# Patient Record
Sex: Male | Born: 1980 | Race: White | Hispanic: No | Marital: Married | Smoking: Current some day smoker
Health system: Southern US, Community
[De-identification: ages and names within clinical notes are randomized; demographics above are authoritative.]

## PROBLEM LIST (undated history)

## (undated) DIAGNOSIS — G709 Myoneural disorder, unspecified: Secondary | ICD-10-CM

## (undated) DIAGNOSIS — M5126 Other intervertebral disc displacement, lumbar region: Secondary | ICD-10-CM

## (undated) DIAGNOSIS — Z8781 Personal history of (healed) traumatic fracture: Secondary | ICD-10-CM

---

## 2013-06-06 ENCOUNTER — Ambulatory Visit: Payer: Self-pay | Admitting: Family Medicine

## 2013-07-17 ENCOUNTER — Other Ambulatory Visit: Payer: Self-pay | Admitting: Orthopedic Surgery

## 2013-07-25 ENCOUNTER — Encounter (HOSPITAL_COMMUNITY): Payer: Self-pay | Admitting: Pharmacy Technician

## 2013-07-26 ENCOUNTER — Other Ambulatory Visit (HOSPITAL_COMMUNITY): Payer: Self-pay | Admitting: *Deleted

## 2013-07-27 ENCOUNTER — Encounter (HOSPITAL_COMMUNITY): Payer: Self-pay

## 2013-07-27 ENCOUNTER — Encounter (HOSPITAL_COMMUNITY)
Admission: RE | Admit: 2013-07-27 | Discharge: 2013-07-27 | Disposition: A | Discharge status: Home / Self Care | Payer: BC Managed Care – PPO | Source: Ambulatory Visit | Attending: Specialist | Admitting: Specialist

## 2013-07-27 ENCOUNTER — Ambulatory Visit (HOSPITAL_COMMUNITY)
Admission: RE | Admit: 2013-07-27 | Discharge: 2013-07-27 | Disposition: A | Discharge status: Home / Self Care | Payer: BC Managed Care – PPO | Source: Ambulatory Visit | Attending: Orthopedic Surgery | Admitting: Orthopedic Surgery

## 2013-07-27 ENCOUNTER — Encounter (INDEPENDENT_AMBULATORY_CARE_PROVIDER_SITE_OTHER): Payer: Self-pay

## 2013-07-27 DIAGNOSIS — Z01818 Encounter for other preprocedural examination: Secondary | ICD-10-CM | POA: Insufficient documentation

## 2013-07-27 DIAGNOSIS — R29890 Loss of height: Secondary | ICD-10-CM | POA: Insufficient documentation

## 2013-07-27 DIAGNOSIS — Z01812 Encounter for preprocedural laboratory examination: Secondary | ICD-10-CM | POA: Insufficient documentation

## 2013-07-27 DIAGNOSIS — M545 Low back pain, unspecified: Secondary | ICD-10-CM | POA: Insufficient documentation

## 2013-07-27 HISTORY — DX: Myoneural disorder, unspecified: G70.9

## 2013-07-27 HISTORY — DX: Other intervertebral disc displacement, lumbar region: M51.26

## 2013-07-27 HISTORY — DX: Personal history of (healed) traumatic fracture: Z87.81

## 2013-07-27 LAB — BASIC METABOLIC PANEL
ANION GAP: 13 (ref 5–15)
BUN: 18 mg/dL (ref 6–23)
CHLORIDE: 103 meq/L (ref 96–112)
CO2: 25 mEq/L (ref 19–32)
CREATININE: 1.06 mg/dL (ref 0.50–1.35)
Calcium: 9.7 mg/dL (ref 8.4–10.5)
Glucose, Bld: 85 mg/dL (ref 70–99)
Potassium: 4.1 mEq/L (ref 3.7–5.3)
Sodium: 141 mEq/L (ref 137–147)

## 2013-07-27 LAB — SURGICAL PCR SCREEN
MRSA, PCR: NEGATIVE
STAPHYLOCOCCUS AUREUS: NEGATIVE

## 2013-07-27 LAB — CBC
HCT: 42.5 % (ref 39.0–52.0)
HEMOGLOBIN: 15.2 g/dL (ref 13.0–17.0)
MCH: 28.6 pg (ref 26.0–34.0)
MCHC: 35.8 g/dL (ref 30.0–36.0)
MCV: 80 fL (ref 78.0–100.0)
PLATELETS: 193 10*3/uL (ref 150–400)
RBC: 5.31 MIL/uL (ref 4.22–5.81)
RDW: 11.9 % (ref 11.5–15.5)
WBC: 7.7 10*3/uL (ref 4.0–10.5)

## 2013-07-27 NOTE — Patient Instructions (Addendum)
[name]  07/27/2013   Your procedure is scheduled on: 7-22  -2015 Wednesday at 1000 AM.  Enter through [facility] Entr[facility] and follow signs to [facility]. Arrive at   0800      AM..  Call this number if you have problems the morning of surgery: [phone]  Or Presurgical Testing [phone](Calvin Deleon) For Living Will and/or Health Care Power Attorney Forms: please provide copy for your medical record,may bring AM of surgery(Forms should be already notarized -we do not provide this service).(07-27-13 No information preferred today).    Do not eat food:After Midnight.    Take these medicines the morning of surgery with A SIP OF WATER: Tramadol.   Do not wear jewelry, make-up or nail polish.  Do not wear lotions, powders, or perfumes. You may wear deodorant.  Do not shave 48 hours(2 days) prior to first CHG shower(legs and under arms).(Shaving face and neck okay.)  Do not bring valuables to the hospital.(Hospital is not responsible for lost valuables).  Contacts, dentures or removable bridgework, body piercing, hair pins may not be worn into surgery.  Leave suitcase in the car. After surgery it may be brought to your room.  For patients admitted to the hospital, checkout time is 11:00 AM the day of discharge.(Restricted visitors-Any Persons displaying flu-like symptoms or illness).    Patients discharged the day of surgery will not be allowed to drive home. Must have responsible person with you x 24 hours once discharged.  Name and phone number of your driver:Calvin Deleon -spouse [phone] cell   Special Instructions: CHG(Chlorhedine 4%-"Hibiclens","Betasept","Aplicare") Shower Use Special Wash: see special instructions.(avoid face and genitals)   Please read over the following fact sheets that you were given: MRSA Information,  Incentive Spirometry Instruction.      ________________    [facility] - Preparing for Surgery Before surgery, you can play an  important role.  Because skin is not sterile, your skin needs to be as free of germs as possible.  You can reduce the number of germs on your skin by washing with CHG (chlorahexidine gluconate) soap before surgery.  CHG is an antiseptic cleaner which kills germs and bonds with the skin to continue killing germs even after washing. Please DO NOT use if you have an allergy to CHG or antibacterial soaps.  If your skin becomes reddened/irritated stop using the CHG and inform your nurse when you arrive at Short Stay. Do not shave (including legs and underarms) for at least 48 hours prior to the first CHG shower.  You may shave your face/neck. Please follow these instructions carefully:  1.  Shower with CHG Soap the night before surgery and the  morning of Surgery.  2.  If you choose to wash your hair, wash your hair first as usual with your  normal  shampoo.  3.  After you shampoo, rinse your hair and body thoroughly to remove the  shampoo.                           4.  Use CHG as you would any other liquid soap.  You can apply chg directly  to the skin and wash                       Gently with a scrungie or clean washcloth.  5.  Apply the CHG Soap to your body ONLY FROM THE NECK DOWN.  Do not use on face/ open                           Wound or open sores. Avoid contact with eyes, ears mouth and genitals (private parts).                       Wash face,  Genitals (private parts) with your normal soap.             6.  Wash thoroughly, paying special attention to the area where your surgery  will be performed.  7.  Thoroughly rinse your body with warm water from the neck down.  8.  DO NOT shower/wash with your normal soap after using and rinsing off  the CHG Soap.                9.  Pat yourself dry with a clean towel.            10.  Wear clean pajamas.            11.  Place clean sheets on your bed the night of your first shower and do not  sleep with pets. Day of Surgery : Do not apply any  lotions/deodorants the morning of surgery.  Please wear clean clothes to the hospital/surgery center.  FAILURE TO FOLLOW THESE INSTRUCTIONS MAY RESULT IN THE CANCELLATION OF YOUR SURGERY PATIENT SIGNATURE_________________________________  NURSE SIGNATURE__________________________________  ________________________________________________________________________   Rogelia MireIncentive Spirometer  An incentive spirometer is a tool that can help keep your lungs clear and active. This tool measures how well you are filling your lungs with each breath. Taking long deep breaths may help reverse or decrease the chance of developing breathing (pulmonary) problems (especially infection) following:  A long period of time when you are unable to move or be active. BEFORE THE PROCEDURE   If the spirometer includes an indicator to show your best effort, your nurse or respiratory therapist will set it to a desired goal.  If possible, sit up straight or lean slightly forward. Try not to slouch.  Hold the incentive spirometer in an upright position. INSTRUCTIONS FOR USE  1. Sit on the edge of your bed if possible, or sit up as far as you can in bed or on a chair. 2. Hold the incentive spirometer in an upright position. 3. Breathe out normally. 4. Place the mouthpiece in your mouth and seal your lips tightly around it. 5. Breathe in slowly and as deeply as possible, raising the piston or the ball toward the top of the column. 6. Hold your breath for 3-5 seconds or for as long as possible. Allow the piston or ball to fall to the bottom of the column. 7. Remove the mouthpiece from your mouth and breathe out normally. 8. Rest for a few seconds and repeat Steps 1 through 7 at least 10 times every 1-2 hours when you are awake. Take your time and take a few normal breaths between deep breaths. 9. The spirometer may include an indicator to show your best effort. Use the indicator as a goal to work toward during each  repetition. 10. After each set of 10 deep breaths, practice coughing to be sure your lungs are clear. If you have an incision (the cut made at the time of surgery), support your incision when coughing by placing a pillow or rolled up towels firmly against it. Once you are able to get out  of bed, walk around indoors and cough well. You may stop using the incentive spirometer when instructed by your caregiver.  RISKS AND COMPLICATIONS  Take your time so you do not get dizzy or light-headed.  If you are in pain, you may need to take or ask for pain medication before doing incentive spirometry. It is harder to take a deep breath if you are having pain. AFTER USE  Rest and breathe slowly and easily.  It can be helpful to keep track of a log of your progress. Your caregiver can provide you with a simple table to help with this. If you are using the spirometer at home, follow these instructions: [place] IF:   You are having difficultly using the spirometer.  You have trouble using the spirometer as often as instructed.  Your pain medication is not giving enough relief while using the spirometer.  You develop fever of 100.5 F (38.1 C) or higher. SEEK IMMEDIATE MEDICAL CARE IF:   You cough up bloody sputum that had not been present before.  You develop fever of 102 F (38.9 C) or greater.  You develop worsening pain at or near the incision site. MAKE SURE YOU:   Understand these instructions.  Will watch your condition.  Will get help right away if you are not doing well or get worse. Document Released: 05/10/2006 Document Revised: 03/22/2011 Document Reviewed: 07/11/2006 [facility] Patient Information 2014 [place], [place].   ________________________________________________________________________

## 2013-08-01 ENCOUNTER — Encounter (HOSPITAL_COMMUNITY)
Admission: RE | Disposition: A | Discharge status: Home / Self Care | Payer: Self-pay | Source: Ambulatory Visit | Attending: Specialist

## 2013-08-01 ENCOUNTER — Ambulatory Visit (HOSPITAL_COMMUNITY): Payer: BC Managed Care – PPO

## 2013-08-01 ENCOUNTER — Encounter (HOSPITAL_COMMUNITY): Payer: Self-pay | Admitting: *Deleted

## 2013-08-01 ENCOUNTER — Ambulatory Visit (HOSPITAL_COMMUNITY): Payer: BC Managed Care – PPO | Admitting: Anesthesiology

## 2013-08-01 ENCOUNTER — Ambulatory Visit (HOSPITAL_COMMUNITY)
Admission: RE | Admit: 2013-08-01 | Discharge: 2013-08-02 | Disposition: A | Discharge status: Home / Self Care | Payer: BC Managed Care – PPO | Source: Ambulatory Visit | Attending: Specialist | Admitting: Specialist

## 2013-08-01 ENCOUNTER — Encounter (HOSPITAL_COMMUNITY): Payer: BC Managed Care – PPO | Admitting: Anesthesiology

## 2013-08-01 DIAGNOSIS — M5126 Other intervertebral disc displacement, lumbar region: Secondary | ICD-10-CM | POA: Diagnosis present

## 2013-08-01 DIAGNOSIS — F17200 Nicotine dependence, unspecified, uncomplicated: Secondary | ICD-10-CM | POA: Insufficient documentation

## 2013-08-01 HISTORY — PX: LUMBAR LAMINECTOMY/DECOMPRESSION MICRODISCECTOMY: SHX5026

## 2013-08-01 SURGERY — LUMBAR LAMINECTOMY/DECOMPRESSION MICRODISCECTOMY 1 LEVEL
Anesthesia: General | Site: Back | Laterality: Right

## 2013-08-01 MED ORDER — HYDROCODONE-ACETAMINOPHEN 5-325 MG PO TABS: 1.0000 | ORAL_TABLET | ORAL | Status: DC | PRN

## 2013-08-01 MED ORDER — KCL IN DEXTROSE-NACL 20-5-0.45 MEQ/L-%-% IV SOLN
INTRAVENOUS | Status: DC
Start: 2013-08-01 — End: 2013-08-02
  Filled 2013-08-01 (×2): qty 1000

## 2013-08-01 MED ORDER — MIDAZOLAM HCL 2 MG/2ML IJ SOLN
INTRAMUSCULAR | Status: AC
  Filled 2013-08-01: qty 2

## 2013-08-01 MED ORDER — THROMBIN 5000 UNITS EX SOLR
CUTANEOUS | Status: AC
  Filled 2013-08-01: qty 10000

## 2013-08-01 MED ORDER — MENTHOL 3 MG MT LOZG: 1.0000 | LOZENGE | OROMUCOSAL | Status: DC | PRN

## 2013-08-01 MED ORDER — POLYMYXIN B SULFATE 500000 UNITS IJ SOLR
INTRAMUSCULAR | Status: DC | PRN
  Administered 2013-08-01: 11:00:00

## 2013-08-01 MED ORDER — SUFENTANIL CITRATE 50 MCG/ML IV SOLN
INTRAVENOUS | Status: DC | PRN
Start: 2013-08-01 — End: 2013-08-01
  Administered 2013-08-01: 5 ug via INTRAVENOUS
  Administered 2013-08-01: 15 ug via INTRAVENOUS
  Administered 2013-08-01: 10 ug via INTRAVENOUS
  Administered 2013-08-01 (×2): 5 ug via INTRAVENOUS

## 2013-08-01 MED ORDER — DOCUSATE SODIUM 100 MG PO CAPS
100.0000 mg | ORAL_CAPSULE | Freq: Two times a day (BID) | ORAL | Status: DC
  Administered 2013-08-01 – 2013-08-02 (×3): 100 mg via ORAL

## 2013-08-01 MED ORDER — MEPERIDINE HCL 50 MG/ML IJ SOLN: 6.2500 mg | INTRAMUSCULAR | Status: DC | PRN

## 2013-08-01 MED ORDER — ONDANSETRON HCL 4 MG/2ML IJ SOLN
INTRAMUSCULAR | Status: DC | PRN
  Administered 2013-08-01: 4 mg via INTRAVENOUS

## 2013-08-01 MED ORDER — PROPOFOL 10 MG/ML IV BOLUS
INTRAVENOUS | Status: DC | PRN
  Administered 2013-08-01: 50 mg via INTRAVENOUS
  Administered 2013-08-01: 150 mg via INTRAVENOUS

## 2013-08-01 MED ORDER — HYDROMORPHONE HCL PF 1 MG/ML IJ SOLN
INTRAMUSCULAR | Status: AC
  Filled 2013-08-01: qty 1

## 2013-08-01 MED ORDER — PROPOFOL 10 MG/ML IV BOLUS
INTRAVENOUS | Status: AC
  Filled 2013-08-01: qty 20

## 2013-08-01 MED ORDER — TRAMADOL HCL 50 MG PO TABS
25.0000 mg | ORAL_TABLET | Freq: Four times a day (QID) | ORAL | Status: DC | PRN
Start: 2013-08-01 — End: 2013-08-02
  Administered 2013-08-01: 50 mg via ORAL
  Filled 2013-08-01 (×2): qty 1

## 2013-08-01 MED ORDER — ROCURONIUM BROMIDE 100 MG/10ML IV SOLN
INTRAVENOUS | Status: AC
  Filled 2013-08-01: qty 1

## 2013-08-01 MED ORDER — OXYCODONE-ACETAMINOPHEN 5-325 MG PO TABS
1.0000 | ORAL_TABLET | ORAL | Status: DC | PRN
  Administered 2013-08-01: 1 via ORAL
  Administered 2013-08-01 – 2013-08-02 (×2): 2 via ORAL
  Filled 2013-08-01 (×2): qty 2
  Filled 2013-08-01: qty 1

## 2013-08-01 MED ORDER — HYDROCODONE-ACETAMINOPHEN 5-325 MG PO TABS: 1.0000 | ORAL_TABLET | Freq: Four times a day (QID) | ORAL | Status: DC | PRN

## 2013-08-01 MED ORDER — PROMETHAZINE HCL 25 MG/ML IJ SOLN
6.2500 mg | INTRAMUSCULAR | Status: DC | PRN
Start: 2013-08-01 — End: 2013-08-01

## 2013-08-01 MED ORDER — DEXAMETHASONE SODIUM PHOSPHATE 4 MG/ML IJ SOLN
INTRAMUSCULAR | Status: DC | PRN
  Administered 2013-08-01: 10 mg via INTRAVENOUS

## 2013-08-01 MED ORDER — SODIUM CHLORIDE 0.9 % IJ SOLN: 3.0000 mL | Freq: Two times a day (BID) | INTRAMUSCULAR | Status: DC

## 2013-08-01 MED ORDER — GELATIN ABSORBABLE MT POWD
OROMUCOSAL | Status: DC | PRN
  Administered 2013-08-01: 11:00:00 via TOPICAL

## 2013-08-01 MED ORDER — LACTATED RINGERS IV SOLN: INTRAVENOUS | Status: DC

## 2013-08-01 MED ORDER — GLYCOPYRROLATE 0.2 MG/ML IJ SOLN
INTRAMUSCULAR | Status: DC | PRN
  Administered 2013-08-01: .8 mg via INTRAVENOUS

## 2013-08-01 MED ORDER — LACTATED RINGERS IV SOLN
INTRAVENOUS | Status: DC | PRN
  Administered 2013-08-01: 10:00:00 via INTRAVENOUS

## 2013-08-01 MED ORDER — LIDOCAINE HCL (CARDIAC) 20 MG/ML IV SOLN
INTRAVENOUS | Status: DC | PRN
  Administered 2013-08-01: 100 mg via INTRAVENOUS

## 2013-08-01 MED ORDER — METHOCARBAMOL 500 MG PO TABS: 500.0000 mg | ORAL_TABLET | Freq: Four times a day (QID) | ORAL | Status: DC | PRN

## 2013-08-01 MED ORDER — BUPIVACAINE-EPINEPHRINE 0.5% -1:200000 IJ SOLN
INTRAMUSCULAR | Status: DC | PRN
  Administered 2013-08-01: 10 mL

## 2013-08-01 MED ORDER — MIDAZOLAM HCL 5 MG/5ML IJ SOLN
INTRAMUSCULAR | Status: DC | PRN
  Administered 2013-08-01: 2 mg via INTRAVENOUS

## 2013-08-01 MED ORDER — ROCURONIUM BROMIDE 100 MG/10ML IV SOLN
INTRAVENOUS | Status: DC | PRN
  Administered 2013-08-01: 40 mg via INTRAVENOUS

## 2013-08-01 MED ORDER — ONDANSETRON HCL 4 MG/2ML IJ SOLN
INTRAMUSCULAR | Status: AC
  Filled 2013-08-01: qty 2

## 2013-08-01 MED ORDER — CEFAZOLIN SODIUM-DEXTROSE 2-3 GM-% IV SOLR
2.0000 g | Freq: Three times a day (TID) | INTRAVENOUS | Status: AC
  Administered 2013-08-01 – 2013-08-02 (×3): 2 g via INTRAVENOUS
  Filled 2013-08-01 (×3): qty 50

## 2013-08-01 MED ORDER — NEOSTIGMINE METHYLSULFATE 10 MG/10ML IV SOLN
INTRAVENOUS | Status: DC | PRN
  Administered 2013-08-01: 5 mg via INTRAVENOUS

## 2013-08-01 MED ORDER — HYDROMORPHONE HCL PF 1 MG/ML IJ SOLN
0.2500 mg | INTRAMUSCULAR | Status: DC | PRN
  Administered 2013-08-01 (×4): 0.5 mg via INTRAVENOUS

## 2013-08-01 MED ORDER — LACTATED RINGERS IV SOLN
INTRAVENOUS | Status: DC
  Administered 2013-08-01: 1000 mL via INTRAVENOUS

## 2013-08-01 MED ORDER — ONDANSETRON HCL 4 MG/2ML IJ SOLN
4.0000 mg | INTRAMUSCULAR | Status: DC | PRN
  Administered 2013-08-01: 4 mg via INTRAVENOUS
  Filled 2013-08-01: qty 2

## 2013-08-01 MED ORDER — LIDOCAINE HCL (CARDIAC) 20 MG/ML IV SOLN
INTRAVENOUS | Status: AC
  Filled 2013-08-01: qty 5

## 2013-08-01 MED ORDER — PHENOL 1.4 % MT LIQD: 1.0000 | OROMUCOSAL | Status: DC | PRN

## 2013-08-01 MED ORDER — ACETAMINOPHEN 325 MG PO TABS
650.0000 mg | ORAL_TABLET | ORAL | Status: DC | PRN
  Filled 2013-08-01: qty 2

## 2013-08-01 MED ORDER — BUPIVACAINE-EPINEPHRINE (PF) 0.5% -1:200000 IJ SOLN
INTRAMUSCULAR | Status: AC
  Filled 2013-08-01: qty 30

## 2013-08-01 MED ORDER — METHOCARBAMOL 500 MG PO TABS: 500.0000 mg | ORAL_TABLET | Freq: Four times a day (QID) | ORAL | Status: DC

## 2013-08-01 MED ORDER — HYDROMORPHONE HCL PF 1 MG/ML IJ SOLN
0.5000 mg | INTRAMUSCULAR | Status: DC | PRN
  Administered 2013-08-01: 1 mg via INTRAVENOUS
  Filled 2013-08-01: qty 1

## 2013-08-01 MED ORDER — SODIUM CHLORIDE 0.9 % IJ SOLN
INTRAMUSCULAR | Status: AC
  Filled 2013-08-01: qty 10

## 2013-08-01 MED ORDER — METHOCARBAMOL 1000 MG/10ML IJ SOLN
500.0000 mg | Freq: Four times a day (QID) | INTRAVENOUS | Status: DC | PRN
  Administered 2013-08-01: 500 mg via INTRAVENOUS
  Filled 2013-08-01: qty 5

## 2013-08-01 MED ORDER — CEFAZOLIN SODIUM-DEXTROSE 2-3 GM-% IV SOLR
INTRAVENOUS | Status: AC
  Filled 2013-08-01: qty 50

## 2013-08-01 MED ORDER — SUFENTANIL CITRATE 50 MCG/ML IV SOLN
INTRAVENOUS | Status: AC
  Filled 2013-08-01: qty 1

## 2013-08-01 MED ORDER — SODIUM CHLORIDE 0.9 % IJ SOLN: 3.0000 mL | INTRAMUSCULAR | Status: DC | PRN

## 2013-08-01 MED ORDER — CEFAZOLIN SODIUM-DEXTROSE 2-3 GM-% IV SOLR
2.0000 g | INTRAVENOUS | Status: AC
  Administered 2013-08-01: 2 g via INTRAVENOUS

## 2013-08-01 MED ORDER — SODIUM CHLORIDE 0.9 % IV SOLN: 250.0000 mL | INTRAVENOUS | Status: DC

## 2013-08-01 MED ORDER — DOCUSATE SODIUM 100 MG PO CAPS: 100.0000 mg | ORAL_CAPSULE | Freq: Two times a day (BID) | ORAL | Status: DC

## 2013-08-01 MED ORDER — ACETAMINOPHEN 650 MG RE SUPP: 650.0000 mg | RECTAL | Status: DC | PRN

## 2013-08-01 SURGICAL SUPPLY — 45 items
BAG ZIPLOCK 12X15 (MISCELLANEOUS) IMPLANT
CLEANER TIP ELECTROSURG 2X2 (MISCELLANEOUS) ×3 IMPLANT
CLOSURE WOUND 1/2 X4 (GAUZE/BANDAGES/DRESSINGS) ×1
CLOTH 2% CHLOROHEXIDINE 3PK (PERSONAL CARE ITEMS) ×3 IMPLANT
DRAPE MICROSCOPE LEICA (MISCELLANEOUS) ×3 IMPLANT
DRAPE POUCH INSTRU U-SHP 10X18 (DRAPES) ×3 IMPLANT
DRAPE SURG 17X11 SM STRL (DRAPES) ×3 IMPLANT
DRAPE UTILITY XL STRL (DRAPES) ×3 IMPLANT
DRSG AQUACEL AG ADV 3.5X 4 (GAUZE/BANDAGES/DRESSINGS) ×3 IMPLANT
DRSG AQUACEL AG ADV 3.5X 6 (GAUZE/BANDAGES/DRESSINGS) IMPLANT
DURAPREP 26ML APPLICATOR (WOUND CARE) ×3 IMPLANT
DURASEAL SPINE SEALANT 3ML (MISCELLANEOUS) IMPLANT
ELECT BLADE TIP CTD 4 INCH (ELECTRODE) IMPLANT
ELECT REM PT RETURN 9FT ADLT (ELECTROSURGICAL) ×3
ELECTRODE REM PT RTRN 9FT ADLT (ELECTROSURGICAL) ×1 IMPLANT
GLOVE BIOGEL PI IND STRL 7.5 (GLOVE) ×1 IMPLANT
GLOVE BIOGEL PI INDICATOR 7.5 (GLOVE) ×2
GLOVE SURG SS PI 7.5 STRL IVOR (GLOVE) ×3 IMPLANT
GLOVE SURG SS PI 8.0 STRL IVOR (GLOVE) ×6 IMPLANT
GOWN STRL REUS W/TWL XL LVL3 (GOWN DISPOSABLE) ×6 IMPLANT
IV CATH 14GX2 1/4 (CATHETERS) ×3 IMPLANT
KIT BASIN OR (CUSTOM PROCEDURE TRAY) ×3 IMPLANT
KIT POSITIONING SURG ANDREWS (MISCELLANEOUS) ×3 IMPLANT
MANIFOLD NEPTUNE II (INSTRUMENTS) ×3 IMPLANT
NEEDLE SPNL 18GX3.5 QUINCKE PK (NEEDLE) ×6 IMPLANT
PATTIES SURGICAL .5 X.5 (GAUZE/BANDAGES/DRESSINGS) IMPLANT
PATTIES SURGICAL .75X.75 (GAUZE/BANDAGES/DRESSINGS) IMPLANT
PATTIES SURGICAL 1X1 (DISPOSABLE) IMPLANT
RUBBERBAND STERILE (MISCELLANEOUS) ×6 IMPLANT
SPONGE SURGIFOAM ABS GEL 100 (HEMOSTASIS) ×3 IMPLANT
STAPLER VISISTAT (STAPLE) IMPLANT
STRIP CLOSURE SKIN 1/2X4 (GAUZE/BANDAGES/DRESSINGS) ×2 IMPLANT
SUT NURALON 4 0 TR CR/8 (SUTURE) IMPLANT
SUT PROLENE 3 0 PS 2 (SUTURE) ×3 IMPLANT
SUT VIC AB 1 CT1 27 (SUTURE)
SUT VIC AB 1 CT1 27XBRD ANTBC (SUTURE) IMPLANT
SUT VIC AB 1-0 CT2 27 (SUTURE) ×3 IMPLANT
SUT VIC AB 2-0 CT1 27 (SUTURE)
SUT VIC AB 2-0 CT1 TAPERPNT 27 (SUTURE) IMPLANT
SUT VIC AB 2-0 CT2 27 (SUTURE) ×3 IMPLANT
SYRINGE 3CC LL L/F (MISCELLANEOUS) ×3 IMPLANT
TOWEL OR 17X26 10 PK STRL BLUE (TOWEL DISPOSABLE) ×3 IMPLANT
TOWEL OR NON WOVEN STRL DISP B (DISPOSABLE) IMPLANT
TRAY LAMINECTOMY (CUSTOM PROCEDURE TRAY) ×3 IMPLANT
YANKAUER SUCT BULB TIP NO VENT (SUCTIONS) IMPLANT

## 2013-08-01 NOTE — Anesthesia Preprocedure Evaluation (Addendum)

## 2013-08-01 NOTE — H&P (Signed)
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 The various methods of treatment have been discussed with the patient and family. After consideration of risks, benefits and other options for treatment, the patient has consented to  Procedure(s): MICRO LUMBAR DECOMPRESSION L4-5 ON THE Right 1 LEVEL (Right) as a surgical intervention .  The patient's history has been reviewed, patient examined, no change in status, stable for surgery.  I have reviewed the patient's chart and labs.  Questions were answered to the patient's satisfaction.

## 2013-08-01 NOTE — Transfer of Care (Signed)
Immediate Anesthesia Transfer of Care Note  Patient: Calvin Deleon  Procedure(s) Performed: Procedure(s): MICRO LUMBAR DECOMPRESSION L4 - L5 ON THE RIGHT 1 LEVEL (Right)  Patient Location: PACU  Anesthesia Type:General  Level of Consciousness: awake, alert  and oriented  Airway & Oxygen Therapy: Patient Spontanous Breathing and Patient connected to face mask oxygen  Post-op Assessment: Report given to PACU RN and Post -op Vital signs reviewed and stable  Post vital signs: Reviewed and stable  Complications: No apparent anesthesia complications

## 2013-08-01 NOTE — Discharge Instructions (Signed)
Walk As Tolerated utilizing back precautions.  No bending, twisting, or lifting.  No driving for 2 weeks.   °Aquacel dressing may remain in place for 7 days. May shower with aquacel dressing in place. After 7 days, remove aquacel dressing and place gauze and tape dressing which should be kept clean and dry and changed daily. Do not remove steri-strips if they are present. °See Dr. Beane in office in 10 to 14 days. Begin taking aspirin 81mg per day starting 4 days after your surgery if not allergic to aspirin or on another blood thinner. °Walk daily even outside. Use a cane or walker only if necessary. °Avoid sitting on soft sofas. ° °

## 2013-08-01 NOTE — Transfer of Care (Signed)
Immediate Anesthesia Transfer of Care Note  Patient: Calvin Deleon  Procedure(s) Performed: Procedure(s): MICRO LUMBAR DECOMPRESSION L4 - L5 ON THE RIGHT 1 LEVEL (Right)  Patient Location: PACU  Anesthesia Type:General  Level of Consciousness: awake, alert  and oriented  Airway & Oxygen Therapy: Patient Spontanous Breathing and Patient connected to face mask oxygen  Post-op Assessment: Report given to PACU RN and Post -op Vital signs reviewed and stable  Post vital signs: Reviewed and stable  Complications: No apparent anesthesia complications 

## 2013-08-01 NOTE — H&P (Signed)
Calvin Deleon is an 33 y.o. male.   Chief Complaint: back and R leg pain HPI: 14 weeks s/p injury with R leg pain, numbness and tingling into R foot, pain with sitting and flexion. Refractory to medications, activity modifications, relative rest, steroid.  Past Medical History  Diagnosis Date  . Lumbar herniated disc   . Neuromuscular disorder     "sciatic nerve pain to toes-right side at present  . History of fracture     finger x2 , lt. forearm- no retained hardware    No past surgical history on file.  No family history on file. Social History:  reports that he has been smoking Cigars.  He does not have any smokeless tobacco history on file. He reports that he drinks alcohol. He reports that he does not use illicit drugs.  Allergies: No Known Allergies  No prescriptions prior to admission    No results found for this or any previous visit (from the past 48 hour(s)). No results found.  Review of Systems  Constitutional: Negative.   HENT: Negative.   Eyes: Negative.   Respiratory: Negative.   Cardiovascular: Negative.   Gastrointestinal: Negative.   Genitourinary: Negative.   Musculoskeletal: Positive for back pain.  Skin: Negative.   Neurological: Positive for sensory change and focal weakness.  Psychiatric/Behavioral: Negative.     There were no vitals taken for this visit. Physical Exam  Constitutional: He is oriented to person, place, and time. He appears well-developed and well-nourished.  HENT:  Head: Normocephalic and atraumatic.  Eyes: Conjunctivae and EOM are normal. Pupils are equal, round, and reactive to light.  Neck: Normal range of motion. Neck supple.  Cardiovascular: Normal rate and regular rhythm.   Respiratory: Effort normal and breath sounds normal.  GI: Soft. Bowel sounds are normal.  Neurological: He is alert and oriented to person, place, and time. He has normal reflexes.  Skin: Skin is warm and dry.  Psychiatric: He has a normal mood and  affect.   Musculoskeletal: ttp LS junction. Pain with fwd flexion and extension Lspine. SLR R positive with buttock, thigh and calf pain exacerbated with dorsal augmentation maneuver. EHL 4+/5 right otherwise strength WNL. Altered sensation L5 dermatome.  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 All options were discussed and he desires to proceed with surgery.  Plan microlumbar decompression L4-5 right  Wandra Babin M. PA-C for Dr. Shelle IronBeane 08/01/2013, 7:20 AM

## 2013-08-01 NOTE — Interval H&P Note (Signed)
History and Physical Interval Note:  08/01/2013 9:08 AM  Calvin MageMatthew Wilk  has presented today for surgery, with the diagnosis of HNP L4 - L5 on the Right  The various methods of treatment have been discussed with the patient and family. After consideration of risks, benefits and other options for treatment, the patient has consented to  Procedure(s): MICRO LUMBAR DECOMPRESSION L4 - L5 ON THE RIGHT 1 LEVEL (Right) as a surgical intervention .  The patient's history has been reviewed, patient examined, no change in status, stable for surgery.  I have reviewed the patient's chart and labs.  Questions were answered to the patient's satisfaction.     Arcola Freshour C

## 2013-08-01 NOTE — Op Note (Signed)
NAME:  Calvin Deleon, Calvin Deleon NO.:  [id number]  MEDICAL RECORD NO.:  [id number]  LOCATION:  [facility]                         FACILITY:  [facility]  PHYSICIAN:  Calvin Deleon, M.D.    DATE OF BIRTH:  08-19-1980  DATE OF PROCEDURE:  08/01/2013 DATE OF DISCHARGE:                              OPERATIVE REPORT   PREOPERATIVE DIAGNOSES:  Spinal stenosis, herniated nucleus pulposus, degenerative disk disease at L4-5.  POSTOPERATIVE DIAGNOSES:  Spinal stenosis, herniated nucleus pulposus, degenerative disk disease at L4-5.  PROCEDURES PERFORMED: 1. Microlumbar decompression, L4-5, right. 2. Foraminotomies, L4 and L5, right. 3. Lysis of epidural venous plexus. 4. Microdiskectomy, L4-5, right.  ANESTHESIA:  General.  ASSISTANT:  Lanna Poche, PA.  BRIEF HISTORY:  This is a 33 year old with refractory L5 radiculopathy, large HNP, compressing the 5 root.  He was indicated for decompression given EHL, weakness in neural tension signs.  Risk and benefits were discussed including bleeding, infection, damage to neurovascular structures, DVT, PE, anesthetic complications, etc.  TECHNIQUE:  With the patient in supine position, after induction of adequate general anesthesia, 2 g of Kefzol, he was placed prone on the [place] frame.  All bony prominences were well padded.  Lumbar region was prepped and draped in usual sterile fashion.  Two 18-gauge spinal needles were utilized to localize the L4-5 interspace, confirmed with x- ray.  Incision was made from the spinous process of 4-5.  Subcutaneous tissue was dissected.  Electrocautery was utilized to achieve hemostasis.  Dorsolumbar fascia was divided.  We split the paraspinous musculature, placed a self-retaining retractor.  Operating microscope was draped and brought on the surgical field.  After confirmatory radiograph obtained at 4-5, hemilaminotomy of the caudad edge of L4 was performed with the 2-mm Kerrison.  I detached the  ligamentum flavum. Straight curette was utilized to detach the ligamentum flavum from the cephalad edge of 5.  Ligamentum flavum was then removed from the interspace with a neuro patty placed beneath the ligamentum flavum to protect the nerve root.  Hypertrophic ligamentum flavum was noted. Facet arthropathy was noted and enlargement.  I performed a generous foraminotomy of L5, identified the 5 root, gently mobilizing it medially.  It was tethered along the 5 root of the shoulder and down the 5 root laterally, but extensive epidural venous plexus.  This was meticulously cauterized and divided to further mobilize the 5 root.  We then identified an extruded fragment from the disk space at 4-5.  This was removed with a micropituitary and a nerve hook, two large fragments were removed.  We then entered the disk space with a micropituitary and performed a full diskectomy of all herniated material.  We irrigated the disk space with antibiotic irrigation and then again checked and no residual disk herniation.  Foraminotomy of 4 was performed as well. Inspection revealed no evidence of CSF leakage or active bleeding. There was 1 cm of excursion of the L5 nerve root medial to the pedicle without tension.  We obtained a confirmatory radiograph.  It was copiously irrigated.  After this, thrombin-soaked Gelfoam was placed in the laminotomy defect.  We removed the McCullough retractor, irrigated the paraspinous musculature, closed the fascia with 1  Vicryl, subcu with 2-0, skin with Prolene stitch.  Sterile dressing was applied.  Placed supine on the hospital bed, extubated without difficulty, and transported to the recovery room in satisfactory condition.  The patient tolerated the procedure well.  No complications.  Minimal blood loss.  Assistant, Lanna PocheJacqueline Bissell, [place].     Calvin EveryJeffrey Halaina Deleon, M.D.     Cordelia PenJB/MEDQ  D:  08/01/2013  T:  08/01/2013  Job:  [id number]

## 2013-08-01 NOTE — Brief Op Note (Signed)
08/01/2013  11:34 AM  PATIENT:  Calvin MageMatthew Deleon  33 y.o. male  PRE-OPERATIVE DIAGNOSIS:  HNP L4 - L5 on the Right  POST-OPERATIVE DIAGNOSIS:  Herniated Nucleus Pulplsus L4-L5 Right  PROCEDURE:  Procedure(s): MICRO LUMBAR DECOMPRESSION L4 - L5 ON THE RIGHT 1 LEVEL (Right)  SURGEON:  Surgeon(s) and Role:    * Javier DockerJeffrey C Tawania Daponte, MD - Primary  PHYSICIAN ASSISTANT:   ASSISTANTS: Bissell   ANESTHESIA:   general  EBL:     BLOOD ADMINISTERED:none  DRAINS: none   LOCAL MEDICATIONS USED:  MARCAINE     SPECIMEN:  Source of Specimen:  [id number]  DISPOSITION OF SPECIMEN:  PATHOLOGY  COUNTS:  YES  TOURNIQUET:  * No tourniquets in log *  DICTATION: .Other Dictation: Dictation Number [id number]  PLAN OF CARE: Admit for overnight observation  PATIENT DISPOSITION:  PACU - hemodynamically stable.   Delay start of Pharmacological VTE agent (>24hrs) due to surgical blood loss or risk of bleeding: yes

## 2013-08-01 NOTE — Anesthesia Postprocedure Evaluation (Signed)
  Anesthesia Post-op Note  Patient: Trish MageMatthew [place]uk  Procedure(s) Performed: Procedure(s) (LRB): MICRO LUMBAR DECOMPRESSION L4 - L5 ON THE RIGHT 1 LEVEL (Right)  Patient Location: PACU  Anesthesia Type: General  Level of Consciousness: awake and alert   Airway and Oxygen Therapy: Patient Spontanous Breathing  Post-op Pain: mild  Post-op Assessment: Post-op Vital signs reviewed, Patient's Cardiovascular Status Stable, Respiratory Function Stable, Patent Airway and No signs of Nausea or vomiting  Last Vitals:  Filed Vitals:   08/01/13 1147  BP: 124/69  Pulse:   Temp: 36.7 C  Resp: 15    Post-op Vital Signs: stable   Complications: No apparent anesthesia complications

## 2013-08-02 ENCOUNTER — Encounter (HOSPITAL_COMMUNITY): Payer: Self-pay | Admitting: Specialist

## 2013-08-02 NOTE — Progress Notes (Signed)
Subjective: 1 Day Post-Op Procedure(s) (LRB): MICRO LUMBAR DECOMPRESSION L4 - L5 ON THE RIGHT 1 LEVEL (Right) Patient reports pain as moderate.   Seen by Dr. Shelle IronBeane in AM rounds. Reports resolution of leg pain, was initially taking ultram for pain but then needed to switch to dilaudid for back pain. Voiding without difficulty.  Objective: Vital signs in last 24 hours: Temp:  [97.8 F (36.6 C)-98.6 F (37 C)] 98 F (36.7 C) (07/23 0530) Pulse Rate:  [52-98] 78 (07/23 0530) Resp:  [9-18] 16 (07/23 0530) BP: (107-148)/(54-89) 110/64 mmHg (07/23 0530) SpO2:  [94 %-100 %] 97 % (07/23 0530) Weight:  [90.946 kg (200 lb 8 oz)] 90.946 kg (200 lb 8 oz) (07/22 0830)  Intake/Output from previous day: 07/22 0701 - 07/23 0700 In: 2440 [P.O.:440; I.V.:1850; IV Piggyback:150] Out: 600 [Urine:600] Intake/Output this shift:    No results found for this basename: HGB,  in the last 72 hours No results found for this basename: WBC, RBC, HCT, PLT,  in the last 72 hours No results found for this basename: NA, K, CL, CO2, BUN, CREATININE, GLUCOSE, CALCIUM,  in the last 72 hours No results found for this basename: LABPT, INR,  in the last 72 hours  Neurologically intact ABD soft Neurovascular intact Sensation intact distally Intact pulses distally Dorsiflexion/Plantar flexion intact Incision: dressing C/D/I and no drainage No cellulitis present Compartment soft no calf pain  Assessment/Plan: 1 Day Post-Op Procedure(s) (LRB): MICRO LUMBAR DECOMPRESSION L4 - L5 ON THE RIGHT 1 LEVEL (Right) Advance diet Up with therapy D/C IV fluids Plan D/C later today after PT as long as pain remains well controlled Reviewed D/C instructions, dressing instructions, Lspine precautions Follow up 10-14 days post-op for suture removal  BISSELL, JACLYN M. 08/02/2013, 7:34 AM

## 2013-08-02 NOTE — Discharge Summary (Signed)
Physician Discharge Summary   Patient ID: Calvin Deleon MRN: [id number] DOB/AGE: 06-24-1980 33 y.o.  Admit date: 08/01/2013 Discharge date: 08/02/2013  Primary Diagnosis:   HNP L4 - L5 on the Right  Admission Diagnoses:  Past Medical History  Diagnosis Date  . Lumbar herniated disc   . Neuromuscular disorder     "sciatic nerve pain to toes-right side at present  . History of fracture     finger x2 , lt. forearm- no retained hardware   Discharge Diagnoses:   Active Problems:   HNP (herniated nucleus pulposus), lumbar  Procedure:  Procedure(s) (LRB): MICRO LUMBAR DECOMPRESSION L4 - L5 ON THE RIGHT 1 LEVEL (Right)   Consults: None  HPI:  see H&P    Laboratory Data: Hospital Outpatient Visit on 07/27/2013  Component Date Value Ref Range Status  . MRSA, PCR 07/27/2013 NEGATIVE  NEGATIVE Final  . Staphylococcus aureus 07/27/2013 NEGATIVE  NEGATIVE Final   Comment:                                 The Xpert SA Assay (FDA                          approved for NASAL specimens                          in patients over 25 years of age),                          is one component of                          a comprehensive surveillance                          program.  Test performance has                          been validated by [employer] for patients greater                          than or equal to 19 year old.                          It is not intended                          to diagnose infection nor to                          guide or monitor treatment.  . Sodium 07/27/2013 141  137 - 147 mEq/L Final  . Potassium 07/27/2013 4.1  3.7 - 5.3 mEq/L Final  . Chloride 07/27/2013 103  96 - 112 mEq/L Final  . CO2 07/27/2013 25  19 - 32 mEq/L Final  . Glucose, Bld 07/27/2013 85  70 - 99 mg/dL Final  . BUN 07/27/2013 18  6 - 23 mg/dL Final  . Creatinine, Ser 07/27/2013 1.06  0.50 -  1.35 mg/dL Final  . Calcium 07/27/2013 9.7  8.4 - 10.5 mg/dL  Final  . GFR calc non Af Amer 07/27/2013 >90  >90 mL/min Final  . GFR calc Af Amer 07/27/2013 >90  >90 mL/min Final   Comment: (NOTE)                          The eGFR has been calculated using the CKD EPI equation.                          This calculation has not been validated in all clinical situations.                          eGFR's persistently <90 mL/min signify possible Chronic Kidney                          Disease.  . Anion gap 07/27/2013 13  5 - 15 Final  . WBC 07/27/2013 7.7  4.0 - 10.5 K/uL Final  . RBC 07/27/2013 5.31  4.22 - 5.81 MIL/uL Final  . Hemoglobin 07/27/2013 15.2  13.0 - 17.0 g/dL Final  . HCT 07/27/2013 42.5  39.0 - 52.0 % Final  . MCV 07/27/2013 80.0  78.0 - 100.0 fL Final  . MCH 07/27/2013 28.6  26.0 - 34.0 pg Final  . MCHC 07/27/2013 35.8  30.0 - 36.0 g/dL Final  . RDW 07/27/2013 11.9  11.5 - 15.5 % Final  . Platelets 07/27/2013 193  150 - 400 K/uL Final   No results found for this basename: HGB,  in the last 72 hours No results found for this basename: WBC, RBC, HCT, PLT,  in the last 72 hours No results found for this basename: NA, K, CL, CO2, BUN, CREATININE, GLUCOSE, CALCIUM,  in the last 72 hours No results found for this basename: LABPT, INR,  in the last 72 hours  X-Rays:Dg Lumbar Spine 2-3 Views  07/27/2013   CLINICAL DATA:  Preop for lumbar decompression.  Low back pain.  EXAM: LUMBAR SPINE - 2-3 VIEW  COMPARISON:  06/06/2013  FINDINGS: No fracture. No spondylolisthesis. There is moderate loss of disc height at L4-L5 and L5-S1. Remaining lumbar disc spaces are well preserved show L5 there is a partly sacralized lumbosacral vertebrae, attention to the sacral ala on the left.  Normal soft tissues.  IMPRESSION: Lower lumbar spine degenerative change, without significant change from the prior study. No fracture or acute finding.   Electronically Signed   By: Lajean Manes M.D.   On: 07/27/2013 15:50   Dg Spine Portable 1 View  08/01/2013   CLINICAL DATA:   L4-5 lumbar laminectomy  EXAM: PORTABLE SPINE - 1 VIEW  COMPARISON:  Lumbar spine films of 07/27/2013  FINDINGS: A cross-table lateral view labeled number 3 was done portably in the operating room and returned for interpretation. An instrument is directed into the L4-5 interspace for localization. The lumbar vertebrae are in normal alignment. Somewhat diminished L5-S1 disc space is noted.  IMPRESSION: Instrument positioned at the L4-5 interspace.   Electronically Signed   By: Ivar Drape M.D.   On: 08/01/2013 11:41   Dg Spine Portable 1 View  08/01/2013   CLINICAL DATA:  A lumbar surgery at L4-5  EXAM: PORTABLE SPINE - 1 VIEW  COMPARISON:  Lateral lumbar spine film from earlier today as well as  lumbar spine films of 07/27/2013  FINDINGS: An instrument is positioned posteriorly between the spinous processes of L4 and L5 direct toward the interspace of L4-5.  IMPRESSION: Instrument directed toward L4-5 interspace.   Electronically Signed   By: Ivar Drape M.D.   On: 08/01/2013 11:21   Dg Spine Portable 1 View  08/01/2013   CLINICAL DATA:  Lumbar laminectomy  EXAM: PORTABLE SPINE - 1 VIEW  COMPARISON:  Lumbar spine films of 07/27/2013  FINDINGS: A crosstable lateral view done portably in the operating room was returned. There is a needle lying just above the spinous process of L5 directed toward the L4-5 interspace. The more caudal needle lies just below the spinous process of L5, directed toward the L5-S1 interspace.  IMPRESSION: Needles are located just above and below the spinous process of L5 as described above.   Electronically Signed   By: Ivar Drape M.D.   On: 08/01/2013 11:06    EKG:No orders found for this or any previous visit.   Hospital Course: Patient was admitted to [facility] and taken to the OR and underwent the above state procedure without complications.  Patient tolerated the procedure well and was later transferred to the recovery room and then to the orthopaedic floor for  postoperative care.  They were given PO and IV analgesics for pain control following their surgery.  They were given 24 hours of postoperative antibiotics.   PT was consulted postop to assist with mobility and transfers.  The patient was allowed to be WBAT with therapy and was taught back precautions. Discharge planning was consulted to help with postop disposition and equipment needs.  Patient had a good night on the evening of surgery and started to get up OOB with therapy on day one. Patient was seen in rounds and was ready to go home on day one.  They were given discharge instructions and dressing directions.  They were instructed on when to follow up in the office with Dr. Tonita Cong.   Diet: low sodium heart healthy Activity:WBAT; Lspine precautions Follow-up:in 10-14 days Disposition - Home Discharged Condition: good   Discharge Instructions   Call MD / Call 911    Complete by:  As directed   If you experience chest pain or shortness of breath, CALL 911 and be transported to the hospital emergency room.  If you develope a fever above 101 F, pus (white drainage) or increased drainage or redness at the wound, or calf pain, call your surgeon's office.     Constipation Prevention    Complete by:  As directed   Drink plenty of fluids.  Prune juice may be helpful.  You may use a stool softener, such as Colace (over the counter) 100 mg twice a day.  Use MiraLax (over the counter) for constipation as needed.     Diet - low sodium heart healthy    Complete by:  As directed      Increase activity slowly as tolerated    Complete by:  As directed             Medication List    STOP taking these medications       metaxalone 800 MG tablet  Commonly known as:  SKELAXIN      TAKE these medications       acetaminophen 500 MG tablet  Commonly known as:  TYLENOL  Take 500-1,000 mg by mouth every 6 (six) hours as needed for mild pain or moderate pain.  docusate sodium 100 MG capsule    Commonly known as:  COLACE  Take 1 capsule (100 mg total) by mouth 2 (two) times daily.     HYDROcodone-acetaminophen 5-325 MG per tablet  Commonly known as:  NORCO  Take 1-2 tablets by mouth every 6 (six) hours as needed for moderate pain.     methocarbamol 500 MG tablet  Commonly known as:  ROBAXIN  Take 1 tablet (500 mg total) by mouth 4 (four) times daily.     naproxen sodium 220 MG tablet  Commonly known as:  ANAPROX  Take 220 mg by mouth 2 (two) times daily as needed (for pain).     traMADol 50 MG tablet  Commonly known as:  ULTRAM  Take 25-50 mg by mouth every 6 (six) hours as needed for moderate pain.           Follow-up Information   Follow up with BEANE,JEFFREY C, MD In 2 weeks. (For suture removal)    Specialty:  Orthopedic Surgery   Contact information:   [address] [place] [address] [phone]       Signed: Lacie Draft, PA-C Orthopaedic Surgery 08/02/2013, 7:41 AM

## 2013-08-02 NOTE — Progress Notes (Signed)
[facility]  [facility] is providing the following services: Commode  If patient discharges after hours, please call [phone].   Renard HamperLecretia Williamson 08/02/2013, 10:13 AM

## 2013-08-02 NOTE — Evaluation (Addendum)
Occupational Therapy Evaluation Patient Details Name: Calvin MageMatthew Lajara MRN: [id number] DOB: 1980/05/11 Today's Date: 08/02/2013    History of Present Illness pt is s/p L4-5 micro lumbar decompression   Clinical Impression   This 33 year old man was admitted for the above. All education was completed.  Pt does not need any further OT.    Follow Up Recommendations  No OT follow up    Equipment Recommendations  3 in 1 bedside comode    Recommendations for Other Services       Precautions / Restrictions Precautions Precautions: Back Precaution Booklet Issued: Yes (comment) Restrictions Weight Bearing Restrictions: No      Mobility Bed Mobility               General bed mobility comments: did not observe:  pt verbalizes understanding  Transfers Overall transfer level: Independent               General transfer comment: sat briefly on window bench    Balance                                            ADL Overall ADL's : Needs assistance/impaired     Grooming: Supervision/safety;Standing   Upper Body Bathing: Set up;Standing   Lower Body Bathing: Minimal assistance;Sit to/from stand;With adaptive equipment   Upper Body Dressing : Set up;Standing   Lower Body Dressing: Minimal assistance;With adaptive equipment;Sit to/from stand   Toilet Transfer:  Independent;Ambulation (window seat)   Toileting- Clothing Manipulation and Hygiene: Supervision/safety;Sit to/from stand         General ADL Comments: Pt has a Sports administratorreacher; also educated on sock aide and toilet aide.  Demonstrated reacher for doffing sock.  Pt did not feel he needed to practice shower transfer:  educated on safety.  Pt cannot tolerate sitting for long:  up and moving.  Educated on back precautions and ADLs--handout issued.  Pt verbalizes understanding of all     Vision                     Perception     Praxis      Pertinent Vitals/Pain 3/10 back; pt stand  up which is most comfortable position     Hand Dominance     Extremity/Trunk Assessment Upper Extremity Assessment Upper Extremity Assessment: Overall WFL for tasks assessed           Communication Communication Communication: No difficulties   Cognition Arousal/Alertness: Awake/alert Behavior During Therapy: WFL for tasks assessed/performed Overall Cognitive Status: Within Functional Limits for tasks assessed                     General Comments       Exercises       Shoulder Instructions      Home Living Family/patient expects to be discharged to:: Private residence Living Arrangements: Spouse/significant other Available Help at Discharge: Family Type of Home: House       Home Layout: Two level     Bathroom Shower/Tub: Producer, television/film/videoWalk-in shower   Bathroom Toilet: Standard     Home Equipment: None   Additional Comments: has a Sports administratorreacher      Prior Functioning/Environment Level of Independence: Independent             OT Diagnosis:     OT Problem List:     OT Treatment/Interventions:  OT Goals(Current goals can be found in the care plan section)    OT Frequency:     Barriers to D/C:            Co-evaluation              End of Session    Activity Tolerance: Patient tolerated treatment well Patient left: with family/visitor present (standing)   Time: 1610-9604 OT Time Calculation (min): 26 min Charges:  OT General Charges $OT Visit: 1 Procedure OT Evaluation $Initial OT Evaluation Tier I: 1 Procedure OT Treatments $Self Care/Home Management : 8-22 mins G-Codes: OT G-codes **NOT FOR INPATIENT CLASS** Functional Assessment Tool Used: clinical judgment and observation Functional Limitation: Self care Self Care Current Status (V4098): At least 1 percent but less than 20 percent impaired, limited or restricted Self Care Goal Status (J1914): At least 1 percent but less than 20 percent impaired, limited or restricted Self Care  Discharge Status [phone]): At least 1 percent but less than 20 percent impaired, limited or restricted  [facility] 08/02/2013, 9:26 AM Marica Otter, OTR/L [phone] 08/02/2013

## 2013-08-02 NOTE — Evaluation (Signed)
Physical Therapy Evaluation Patient Details Name: Calvin MageMatthew Tewksbury MRN: [id number] DOB: October 30, 1980 Today's Date: 08/02/2013   History of Present Illness  pt is s/p L4-5 micro lumbar decompression due to HNP  Clinical Impression  Patient evaluated by Physical Therapy with no further acute PT needs identified. All education has been completed and the patient has no further questions.  Pt mobilizing well and practiced stairs for safe d/c home with second level.  Pt educated on back precautions during mobility.  Pt and spouse had no further questions/concerns.  PT is signing off. Thank you for this referral.     Follow Up Recommendations No PT follow up    Equipment Recommendations  None recommended by PT    Recommendations for Other Services       Precautions / Restrictions Precautions Precautions: Back Precaution Booklet Issued: Yes (comment) Precaution Comments: pt able to recall and correctly desribe back precautions Restrictions Weight Bearing Restrictions: No      Mobility  Bed Mobility               General bed mobility comments: pt up in room on arrival, discussed log roll technique and sleeping positions  Transfers Overall transfer level: Independent               General transfer comment: not observed but reviewed maintaining back precautions  Ambulation/Gait Ambulation/Gait assistance: Supervision;Modified independent (Device/Increase time) Ambulation Distance (Feet): 350 Feet Assistive device: None Gait Pattern/deviations: WFL(Within Functional Limits)   Gait velocity interpretation: at or above normal speed for age/gender General Gait Details: pt ambulating without difficulty, reviewed back precautions during mobility tasks  Stairs Stairs: Yes Stairs assistance: Supervision Stair Management: No rails;Alternating pattern;Forwards Number of Stairs: 10 General stair comments: no difficulty observed with stairs  Wheelchair Mobility    Modified  Rankin (Stroke Patients Only)       Balance                                             Pertinent Vitals/Pain No c/o pain    Home Living Family/patient expects to be discharged to:: Private residence Living Arrangements: Spouse/significant other Available Help at Discharge: Family Type of Home: House       Home Layout: Two level Home Equipment: None Additional Comments: has a Sports administratorreacher    Prior Function Level of Independence: Independent               Hand Dominance        Extremity/Trunk Assessment   Upper Extremity Assessment: Overall WFL for tasks assessed           Lower Extremity Assessment: Overall WFL for tasks assessed (denies numbness/tingling, able to perform standing heel and toe raises)         Communication   Communication: No difficulties  Cognition Arousal/Alertness: Awake/alert Behavior During Therapy: WFL for tasks assessed/performed Overall Cognitive Status: Within Functional Limits for tasks assessed                      General Comments      Exercises        Assessment/Plan    PT Assessment Patent does not need any further PT services  PT Diagnosis     PT Problem List    PT Treatment Interventions     PT Goals (Current goals can be found in the Care Plan  section) Acute Rehab PT Goals PT Goal Formulation: No goals set, d/c therapy    Frequency     Barriers to discharge        Co-evaluation               End of Session   Activity Tolerance: Patient tolerated treatment well Patient left: Other (comment) (up ambulating around room with spouse) Nurse Communication: Mobility status    Functional Assessment Tool Used: clinical judgement Functional Limitation: Mobility: Walking and moving around Mobility: Walking and Moving Around Current Status ([id number]): At least 1 percent but less than 20 percent impaired, limited or restricted Mobility: Walking and Moving Around Goal Status  [phone]): 0 percent impaired, limited or restricted Mobility: Walking and Moving Around Discharge Status [phone]): 0 percent impaired, limited or restricted    Time: 9147-8295 PT Time Calculation (min): 10 min   Charges:   PT Evaluation $Initial PT Evaluation Tier I: 1 Procedure PT Treatments $Gait Training: 8-22 mins   PT G Codes:   Functional Assessment Tool Used: clinical judgement Functional Limitation: Mobility: Walking and moving around    [employer] [facility] 08/02/2013, 9:39 AM Zenovia Jarred, PT, DPT 08/02/2013 Pager: [phone]

## 2015-12-29 IMAGING — CR DG SPINE 1V PORT
1 series · 1 of 1 positions shown · non-contrast
Comparison: Lumbar spine films of 07/27/2013

CLINICAL DATA: L4-5 lumbar laminectomy

EXAM:
PORTABLE SPINE - 1 VIEW

[AP]
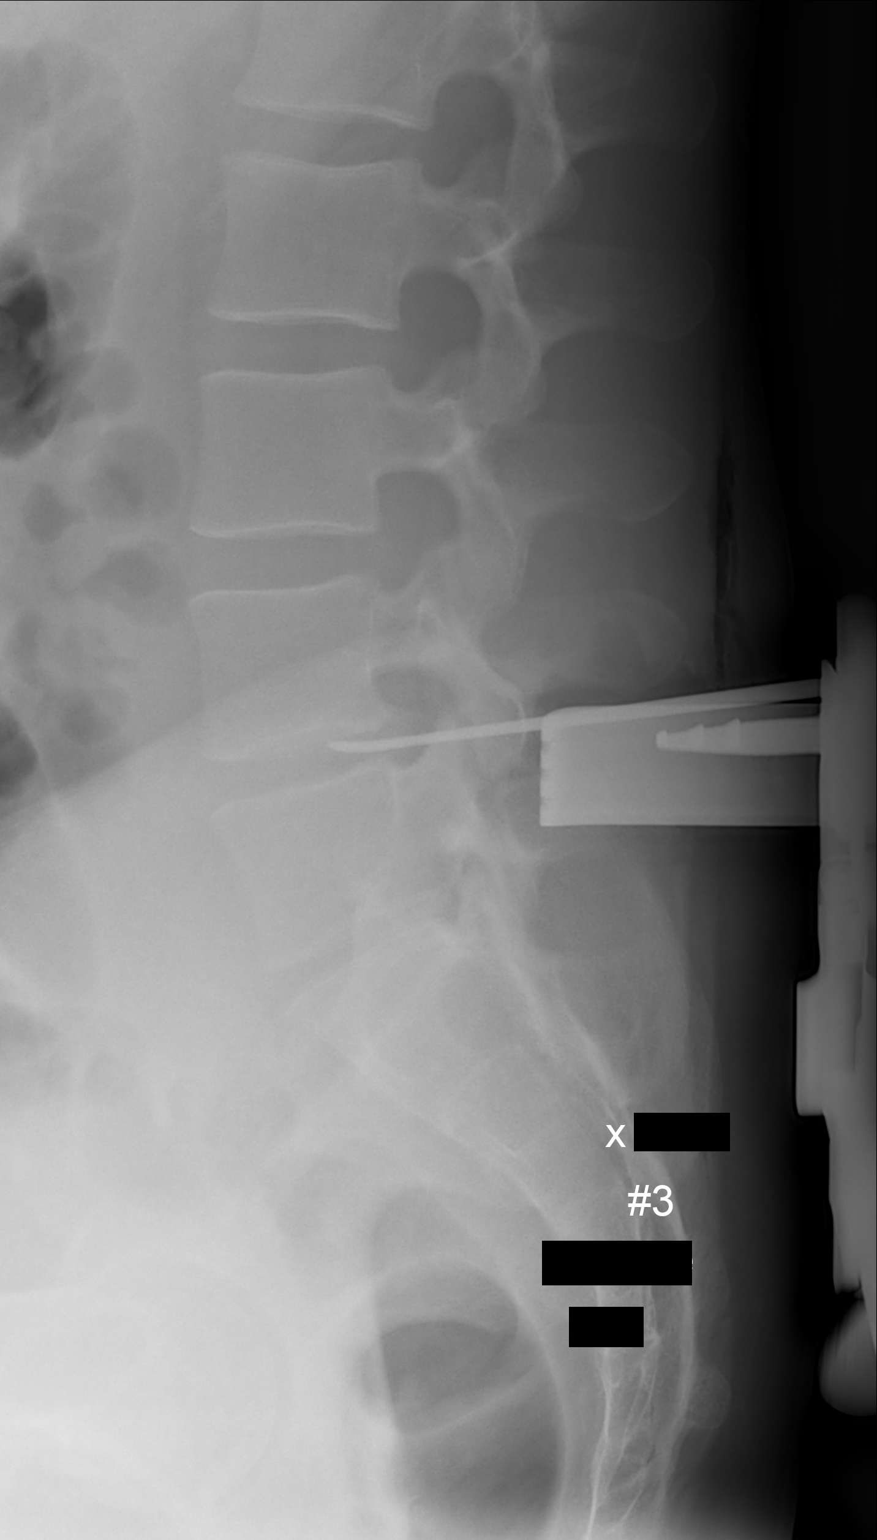

[1 of 1 positions shown; findings below may reference images not displayed]

FINDINGS: A cross-table lateral view labeled number 3 was done portably in the
operating room and returned for interpretation. An instrument is
directed into the L4-5 interspace for localization. The lumbar
vertebrae are in normal alignment. Somewhat diminished L5-S1 disc
space is noted.
IMPRESSION: Instrument positioned at the L4-5 interspace.

## 2015-12-29 IMAGING — CR DG SPINE 1V PORT
1 series · 1 of 1 positions shown · non-contrast
Comparison: Lateral lumbar spine film from earlier today as well as
lumbar spine films of 07/27/2013

CLINICAL DATA: A lumbar surgery at L4-5

EXAM:
PORTABLE SPINE - 1 VIEW

[AP]
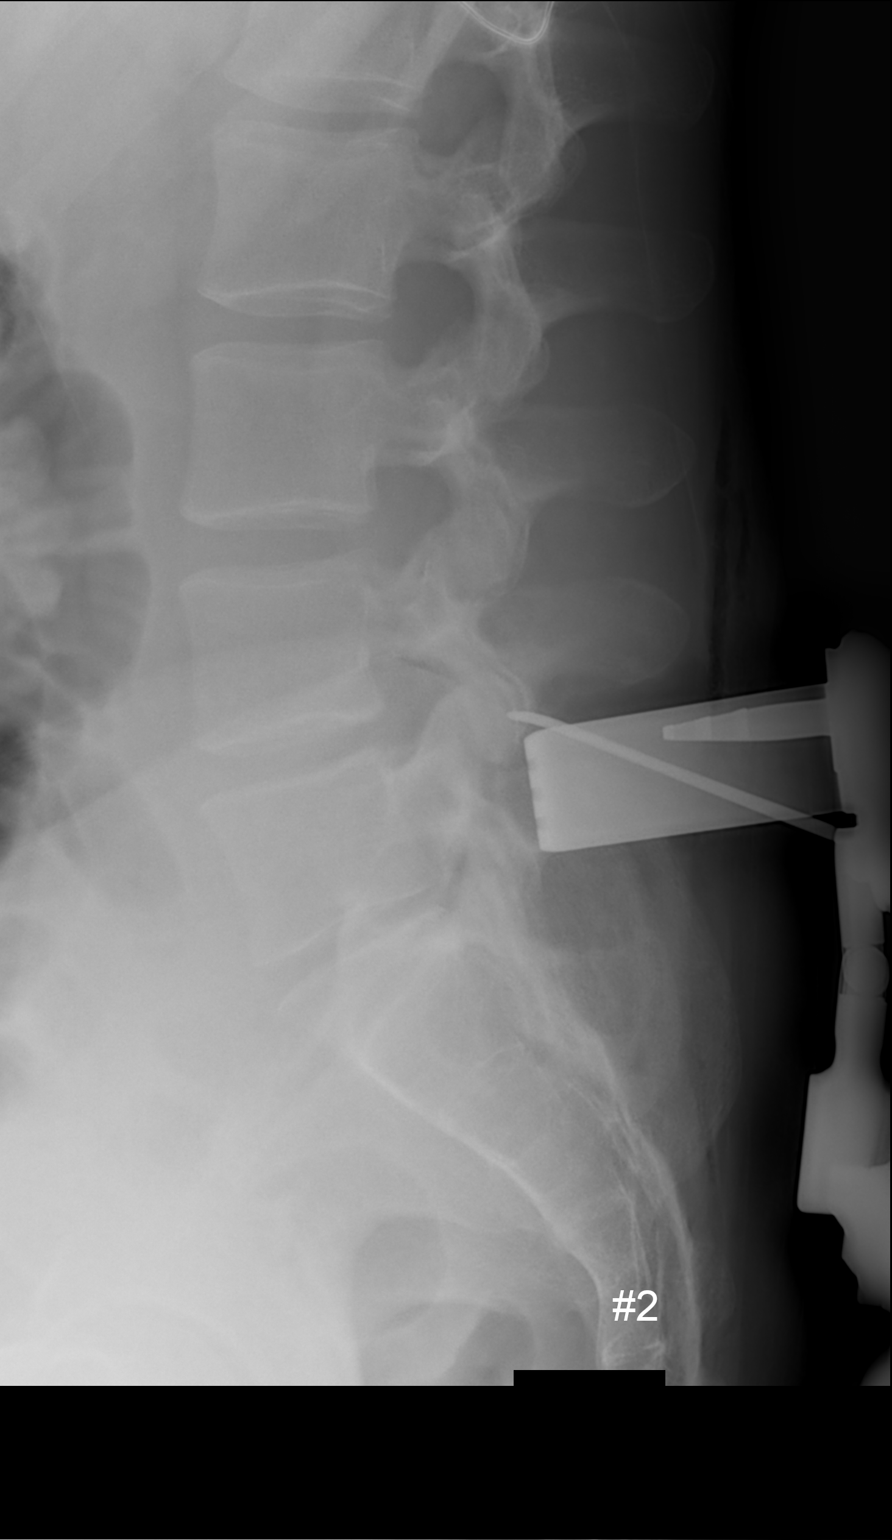

[1 of 1 positions shown; findings below may reference images not displayed]

FINDINGS: An instrument is positioned posteriorly between the spinous
processes of L4 and L5 direct toward the interspace of L4-5.
IMPRESSION: Instrument directed toward L4-5 interspace.

## 2018-09-05 ENCOUNTER — Other Ambulatory Visit: Payer: Self-pay

## 2018-09-05 DIAGNOSIS — Z20822 Contact with and (suspected) exposure to covid-19: Secondary | ICD-10-CM

## 2018-09-06 LAB — NOVEL CORONAVIRUS, NAA: SARS-CoV-2, NAA: NOT DETECTED

## 2018-09-22 ENCOUNTER — Other Ambulatory Visit: Payer: Self-pay | Admitting: *Deleted

## 2018-09-22 DIAGNOSIS — Z20822 Contact with and (suspected) exposure to covid-19: Secondary | ICD-10-CM

## 2018-09-24 LAB — NOVEL CORONAVIRUS, NAA: SARS-CoV-2, NAA: NOT DETECTED

## 2018-11-13 DIAGNOSIS — Z131 Encounter for screening for diabetes mellitus: Secondary | ICD-10-CM | POA: Diagnosis not present

## 2018-11-13 DIAGNOSIS — Z13220 Encounter for screening for lipoid disorders: Secondary | ICD-10-CM | POA: Diagnosis not present

## 2018-11-13 DIAGNOSIS — Z0000 Encounter for general adult medical examination without abnormal findings: Secondary | ICD-10-CM | POA: Diagnosis not present

## 2019-03-16 ENCOUNTER — Ambulatory Visit: Payer: BC Managed Care – PPO

## 2019-03-16 DIAGNOSIS — Z23 Encounter for immunization: Secondary | ICD-10-CM | POA: Diagnosis not present

## 2019-03-27 ENCOUNTER — Other Ambulatory Visit: Payer: Self-pay | Admitting: Obstetrics and Gynecology

## 2019-03-27 DIAGNOSIS — Z3169 Encounter for other general counseling and advice on procreation: Secondary | ICD-10-CM

## 2019-03-27 NOTE — Progress Notes (Signed)
Semen analysis order.

## 2019-03-28 ENCOUNTER — Other Ambulatory Visit: Payer: Self-pay

## 2019-04-18 ENCOUNTER — Ambulatory Visit: Payer: BC Managed Care – PPO

## 2019-04-20 DIAGNOSIS — Z23 Encounter for immunization: Secondary | ICD-10-CM | POA: Diagnosis not present

## 2019-05-15 DIAGNOSIS — Z3169 Encounter for other general counseling and advice on procreation: Secondary | ICD-10-CM | POA: Diagnosis not present

## 2019-05-15 LAB — SEMEN ANALYSIS, BASIC
Appearance: NORMAL
Concentration, Sperm: 88.3 x10E6/mL (ref >14.9)
Immotile Sperm: 41 %
Leukocyte Concentration: <1 x10E6/mL (ref <1.00)
Non-Progressive (NP): 7 %
Normal Morphology-Strict: 13 % (ref >3)
Progressive Motility (PR): 52 % (ref >31)
Progressively Motile Sperm: 273.1 x10E6
Time Collected: 1330
Time Received: 1400
Time Since Last Emission: 5 days
Total Motile Sperm: 310.7 x10E6
Total Motility (PR+NP): 59 % (ref >39)
Total Sperm in Ejaculate: 529.6 x10E6 (ref >38.9)
Volume: 6 mL (ref >1.4)
pH: 9 (ref >7.1)

## 2019-08-12 DIAGNOSIS — Z20822 Contact with and (suspected) exposure to covid-19: Secondary | ICD-10-CM | POA: Diagnosis not present

## 2020-04-06 DIAGNOSIS — Z20822 Contact with and (suspected) exposure to covid-19: Secondary | ICD-10-CM | POA: Diagnosis not present

## 2020-05-20 NOTE — Progress Notes (Signed)
New Patient Note  RE: Calvin Deleon MRN: [id number] DOB: [birth year] Date of Office Visit: 05/21/2020  Consult requested by: No ref. provider found Primary care provider: Clovis Riley, L.August Saucer, MD  Chief Complaint: Nasal Congestion  History of Present Illness: I had the pleasure of seeing Calvin Deleon for initial evaluation at the [facility] on 05/22/2020. He is a 40 y.o. male, who is self-referred here for the evaluation of nasal congestion.  He reports symptoms of nasal congestion, throat clearing, PND, itching, rhinorrhea, little sneezing. Symptoms have been going on for 20 years. The symptoms are present all year around with worsening in winter and spring. Anosmia: no. Headache: no. He has used Claritin, allegra, zyrtec, Xyzal, Flonase with some improvement in symptoms. Sinus infections: none. Previous work up includes: no. Previous ENT evaluation: yes for ear issues. Previous sinus imaging: no. History of nasal polyps: no. History of reflux: no.  Patient travels a lot and sometimes notices flares when traveling.   Assessment and Plan: Shameer is a 40 y.o. male with: Other allergic rhinitis Perennial rhinitis symptoms for 20 years with worsening in the spring.  Tried Claritin, Allegra, Zyrtec, Xyzal and Flonase with some benefit.  No prior allergy evaluation.  Today's skin testing showed: Positive to mold, dust mites, grass, weed pollen.   Start environmental control measures as below.  Try Ryvent 6mg  twice a day.   This replaces Xyzal. If not covered let know.   This was not covered, sent in carbinoxamine 4mg  tablet (1 to 1.5 tablet) twice a day as needed. Start dymista (fluticasone + azelastine nasal spray combination) 1 spray per nostril twice a day. This replaces Flonase (fluticasone) for now. If it's not covered let us know.  This was not covered. May use Flonase (fluticasone) nasal spray 1 spray per nostril twice a day as needed for nasal congestion.  May use azelastine nasal spray 1-2 sprays per nostril twice a day as needed for runny nose/drainage.  Nasal saline spray (i.e., Simply Saline) or nasal saline lavage (i.e., NeilMed) is recommended as needed and prior to medicated nasal sprays.  Consider allergy injections for long term control if above medications do not help the symptoms - handout given.   Bilateral impacted cerumen  May use over the counter debrox 5-10 drops twice a day for up to 4 days in a row to soften the earwax.   Return in about 3 months (around 08/21/2020).  Meds ordered this encounter  Medications  . Azelastine & Fluticasone 137 & 50 MCG/ACT THPK    Sig: Place 1 spray into the nose in the morning and at bedtime.    Dispense:  1 each    Refill:  5    Failed flonase  . DISCONTD: Carbinoxamine Maleate (RYVENT) 6 MG TABS    Sig: Take 1 tablet by mouth 2 (two) times daily as needed (allergies).    Dispense:  60 tablet    Refill:  5  . fluticasone (FLONASE) 50 MCG/ACT nasal spray    Sig: Place 1 spray into both nostrils 2 (two) times daily as needed (nasal congestion).    Dispense:  16 g    Refill:  5  . azelastine (ASTELIN) 0.1 % nasal spray    Sig: Place 1-2 sprays into both nostrils 2 (two) times daily as needed (nasal drainage). Use in each nostril as directed    Dispense:  30 mL    Refill:  5  . Carbinoxamine Maleate 4 MG TABS  Sig: Take 1 to 1.5 tablet twice a day for allergies.    Dispense:  60 tablet    Refill:  3   Lab Orders  No laboratory test(s) ordered today    Other allergy screening: Asthma: no Food allergy: no Medication allergy: no Hymenoptera allergy: no Urticaria: no Eczema:no History of recurrent infections suggestive of immunodeficency: no  Diagnostics: Skin Testing: Environmental allergy panel. Positive to mold, dust mites, grass, weed pollen.  Results discussed with patient/family.  Airborne Adult Perc - 05/21/20 0939    Time Antigen Placed 1749    Allergen  Manufacturer Waynette Buttery    Location Back    Number of Test 59    Panel 1 Select    1. Control-Buffer 50% Glycerol Negative    2. Control-Histamine 1 mg/ml 2+    3. Albumin saline Negative    4. Bahia Negative    5. French Southern Territories Negative    6. Johnson Negative    7. Kentucky Blue Negative    8. Meadow Fescue Negative    9. Perennial Rye Negative    10. Sweet Vernal Negative    11. Timothy Negative    12. Cocklebur Negative    13. Burweed Marshelder Negative    14. Ragweed, short Negative    15. Ragweed, Giant Negative    16. Plantain,  English Negative    17. Lamb's Quarters Negative    18. Sheep Sorrell Negative    19. Rough Pigweed Negative    20. Marsh Elder, Rough Negative    21. Mugwort, Common Negative    22. Ash mix Negative    23. Birch mix Negative    24. Beech American Negative    25. Box, Elder Negative    26. Cedar, red Negative    27. Cottonwood, Guinea-Bissau Negative    28. Elm mix Negative    29. Hickory Negative    30. Maple mix Negative    31. Oak, Guinea-Bissau mix Negative    32. Pecan Pollen Negative    33. Pine mix Negative    34. Sycamore Eastern Negative    35. Walnut, Black Pollen Negative    36. Alternaria alternata Negative    37. Cladosporium Herbarum Negative    38. Aspergillus mix Negative    39. Penicillium mix Negative    40. Bipolaris sorokiniana (Helminthosporium) Negative    41. Drechslera spicifera (Curvularia) 2+    42. Mucor plumbeus 2+    43. Fusarium moniliforme Negative    44. Aureobasidium pullulans (pullulara) Negative    45. Rhizopus oryzae 2+    46. Botrytis cinera Negative    47. Epicoccum nigrum Negative    48. Phoma betae Negative    49. Candida Albicans Negative    50. Trichophyton mentagrophytes Negative    51. Mite, D Farinae  5,000 AU/ml Negative    52. Mite, D Pteronyssinus  5,000 AU/ml 2+    53. Cat Hair 10,000 BAU/ml Negative    54.  Dog Epithelia Negative    55. Mixed Feathers Negative    56. Horse Epithelia Negative    57.  Cockroach, German Negative    58. Mouse Negative    59. Tobacco Leaf Negative          Intradermal - 05/21/20 1013    Time Antigen Placed 1013    Allergen Manufacturer Waynette Buttery    Location Arm    Number of Test 12    Intradermal Select    Control Negative    French Southern Territ[place]  Negative    Johnson Negative    7 Grass 4+    Ragweed mix Negative    Weed mix 2+    Tree mix Negative    Mold 1 2+    Mold 2 3+    Cat Negative    Dog Negative    Cockroach Negative           Past Medical History: Patient Active Problem List   Diagnosis Date Noted  . Other allergic rhinitis 05/22/2020  . Bilateral impacted cerumen 05/22/2020  . HNP (herniated nucleus pulposus), lumbar 08/01/2013   Past Medical History:  Diagnosis Date  . History of fracture    finger x2 , lt. forearm- no retained hardware  . Lumbar herniated disc   . 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 LUMBAR LAMINECTOMY/DECOMPRESSION MICRODISCECTOMY Right 08/01/2013   Procedure: MICRO LUMBAR DECOMPRESSION L4 - L5 ON THE RIGHT 1 LEVEL;  Surgeon: Javier Docker, MD;  Location: [facility];  Service: Orthopedics;  Laterality: Right;   Medication List:  Current Outpatient Medications  Medication Sig Dispense Refill  . acetaminophen (TYLENOL) 500 MG tablet Take 500-1,000 mg by mouth every 6 (six) hours as needed for mild pain or moderate pain.    . Azelastine & Fluticasone 137 & 50 MCG/ACT THPK Place 1 spray into the nose in the morning and at bedtime. 1 each 5  . azelastine (ASTELIN) 0.1 % nasal spray Place 1-2 sprays into both nostrils 2 (two) times daily as needed (nasal drainage). Use in each nostril as directed 30 mL 5  . Carbinoxamine Maleate 4 MG TABS Take 1 to 1.5 tablet twice a day for allergies. 60 tablet 3  . COLLAGEN PO Take by mouth.    . fluticasone (FLONASE) 50 MCG/ACT nasal spray Place 1 spray into both  nostrils 2 (two) times daily as needed (nasal congestion). 16 g 5  . GLUCOSAMINE HCL PO Take by mouth.    . Omega-3 Fatty Acids (FISH OIL PO) Take by mouth.    . ST JOHNS WORT PO Take by mouth.    . Turmeric (QC TUMERIC COMPLEX PO) Take by mouth.     No current facility-administered medications for this visit.   Allergies: No Known Allergies Social History: Social History   Socioeconomic History  . Marital status: Married    Spouse name: Not on file  . Number of children: Not on file  . Years of education: Not on file  . Highest education level: Not on file  Occupational History  . Not on file  Tobacco Use  . Smoking status: Current Some Day Smoker    Types: Cigars  . Smokeless tobacco: Never Used  . Tobacco comment: very rare in past  Vaping Use  . Vaping Use: Never used  Substance and Sexual Activity  . Alcohol use: Yes    Comment: occ. social  . Drug use: No  . Sexual activity: Yes  Other Topics Concern  . Not on file  Social History Narrative  . Not on file   Social Determinants of Health   Financial Resource Strain: Not on file  Food Insecurity: Not on file  Transportation Needs: Not on file  Physical Activity: Not on file  Stress: Not on file  Social Connections: Not on file   Lives in a 40 year old house. Smoking: smoke cigars rarely Occupation: president and  CEO  Environmental HistorySurveyor, minerals: Water Damage/mildew in the house: no Carpet in the family room: no Carpet in the bedroom: yes Heating: electric Cooling: central Pet: No  Family History: Family History  Problem Relation Age of Onset  . Allergic rhinitis Mother   . Allergic rhinitis Father   . Asthma Brother   . Allergic rhinitis Brother    Review of Systems  Constitutional: Negative for appetite change, chills, fever and unexpected weight change.  HENT: Positive for congestion, postnasal drip and rhinorrhea.   Eyes: Negative for itching.  Respiratory: Negative for cough, chest tightness,  shortness of breath and wheezing.   Cardiovascular: Negative for chest pain.  Gastrointestinal: Negative for abdominal pain.  Genitourinary: Negative for difficulty urinating.  Skin: Negative for rash.  Allergic/Immunologic: Positive for environmental allergies.  Neurological: Negative for headaches.   Objective: BP 122/80 (BP Location: Left Arm, Patient Position: Sitting, Cuff Size: Normal)   Pulse 63   Temp 98.6 F (37 C) (Temporal)   Resp 18   Ht 5\' 10"  (1.778 m)   Wt 190 lb 12.8 oz (86.5 kg)   SpO2 98%   BMI 27.38 kg/m  Body mass index is 27.38 kg/m. Physical Exam Vitals and nursing note reviewed.  Constitutional:      Appearance: Normal appearance. He is well-developed.  HENT:     Head: Normocephalic and atraumatic.     Right Ear: External ear normal.     Left Ear: External ear normal.     Ears:     Comments: B/l cerumen    Nose: Congestion present.     Mouth/Throat:     Mouth: Mucous membranes are moist.     Pharynx: Oropharynx is clear.  Eyes:     Conjunctiva/sclera: Conjunctivae normal.  Cardiovascular:     Rate and Rhythm: Normal rate and regular rhythm.     Heart sounds: Normal heart sounds. No murmur heard. No friction rub. No gallop.   Pulmonary:     Effort: Pulmonary effort is normal.     Breath sounds: Normal breath sounds. No wheezing, rhonchi or rales.  Musculoskeletal:     Cervical back: Neck supple.  Skin:    General: Skin is warm.     Findings: No rash.  Neurological:     Mental Status: He is alert and oriented to person, place, and time.  Psychiatric:        Behavior: Behavior normal.    The plan was reviewed with the patient/family, and all questions/concerned were addressed.  It was my pleasure to see Molli HazardMatthew today and participate in his care. Please feel free to contact me with any questions or concerns.  Sincerely,  Wyline MoodYoon Bertha Lokken, DO Allergy & Immunology  [facility]  [place] office:  [id number] [facility] [place] office: [phone]

## 2020-05-21 ENCOUNTER — Other Ambulatory Visit: Payer: Self-pay | Admitting: Allergy

## 2020-05-21 ENCOUNTER — Other Ambulatory Visit: Payer: Self-pay

## 2020-05-21 ENCOUNTER — Ambulatory Visit: Payer: BC Managed Care – PPO | Admitting: Allergy

## 2020-05-21 ENCOUNTER — Encounter: Payer: Self-pay | Admitting: Allergy

## 2020-05-21 VITALS — BP 122/80 | HR 63 | Temp 98.6°F | Resp 18 | Ht 70.0 in | Wt 190.8 lb

## 2020-05-21 DIAGNOSIS — H6123 Impacted cerumen, bilateral: Secondary | ICD-10-CM | POA: Diagnosis not present

## 2020-05-21 DIAGNOSIS — J3089 Other allergic rhinitis: Secondary | ICD-10-CM

## 2020-05-21 MED ORDER — AZELASTINE HCL 0.1 % NA SOLN: 1.0000 | Freq: Two times a day (BID) | NASAL | 5 refills | Status: DC | PRN

## 2020-05-21 MED ORDER — AZELASTINE & FLUTICASONE 137 & 50 MCG/ACT NA THPK: 1.0000 | PACK | Freq: Two times a day (BID) | NASAL | 5 refills | Status: DC

## 2020-05-21 MED ORDER — CARBINOXAMINE MALEATE 6 MG PO TABS: 1.0000 | ORAL_TABLET | Freq: Two times a day (BID) | ORAL | 5 refills | Status: DC | PRN

## 2020-05-21 MED ORDER — CARBINOXAMINE MALEATE 4 MG PO TABS: ORAL_TABLET | ORAL | 3 refills | Status: DC

## 2020-05-21 MED ORDER — FLUTICASONE PROPIONATE 50 MCG/ACT NA SUSP: 1.0000 | Freq: Two times a day (BID) | NASAL | 5 refills | Status: DC | PRN

## 2020-05-21 NOTE — Patient Instructions (Addendum)
Today's skin testing showed: Positive to mold, dust mites, grass, weed pollen.  Results given.   Environmental allergies  Start environmental control measures as below.  Try Ryvent 6mg  twice a day.   This replaces Xyzal. If not covered let know.  Start dymista (fluticasone + azelastine nasal spray combination) 1 spray per nostril twice a day. This replaces Flonase (fluticasone) for now. If it's not covered let Calvin Deleon know.    Nasal saline spray (i.e., Simply Saline) or nasal saline lavage (i.e., NeilMed) is recommended as needed and prior to medicated nasal sprays.  Consider allergy injections for long term control if above medications do not help the symptoms - handout given.   Earwax  May use over the counter debrox 5-10 drops twice a day for up to 4 days in a row to soften the earwax.   Follow up in 3 months or sooner if needed.   Control of House Dust Mite Allergen . Dust mite allergens are a common trigger of allergy and asthma symptoms. While they can be found throughout the house, these microscopic creatures thrive in warm, humid environments such as bedding, upholstered furniture and carpeting. . Because so much time is spent in the bedroom, it is essential to reduce mite levels there.  . Encase pillows, mattresses, and box springs in special allergen-proof fabric covers or airtight, zippered plastic covers.  . Bedding should be washed weekly in hot water (130 F) and dried in a hot dryer. Allergen-proof covers are available for comforters and pillows that can't be regularly washed.  Calvin Deleon the allergy-proof covers every few months. Minimize clutter in the bedroom. Keep pets out of the bedroom.  Calvin Deleon Keep humidity less than 50% by using a dehumidifier or air conditioning. You can buy a humidity measuring device called a hygrometer to monitor this.  . If possible, replace carpets with hardwood, linoleum, or washable area rugs. If that's not possible, vacuum frequently with a vacuum  that has a HEPA filter. . Remove all upholstered furniture and non-washable window drapes from the bedroom. . Remove all non-washable stuffed toys from the bedroom.  Wash stuffed toys weekly.  Mold Control . Mold and fungi can grow on a variety of surfaces provided certain temperature and moisture conditions exist.  . Outdoor molds grow on plants, decaying vegetation and soil. The major outdoor mold, Alternaria and Cladosporium, are found in very high numbers during hot and dry conditions. Generally, a late summer - fall peak is seen for common outdoor fungal spores. Rain will temporarily lower outdoor mold spore count, but counts rise rapidly when the rainy period ends. . The most important indoor molds are Aspergillus and Penicillium. Dark, humid and poorly ventilated basements are ideal sites for mold growth. The next most common sites of mold growth are the bathroom and the kitchen. Outdoor (Seasonal) Mold Control . Use air conditioning and keep windows closed. . Avoid exposure to decaying vegetation. 11-07-1975 Avoid leaf raking. . Avoid grain handling. . Consider wearing a face mask if working in moldy areas.  Indoor (Perennial) Mold Control  . Maintain humidity below 50%. . Get rid of mold growth on hard surfaces with water, detergent and, if necessary, 5% bleach (do not mix with other cleaners). Then dry the area completely. If mold covers an area more than 10 square feet, consider hiring an indoor environmental professional. . For clothing, washing with soap and water is best. If moldy items cannot be cleaned and dried, throw them away. . Remove sources  e.g. contaminated carpets. . Repair and seal leaking roofs or pipes. Using dehumidifiers in damp basements may be helpful, but empty the water and clean units regularly to prevent mildew from forming. All rooms, especially basements, bathrooms and kitchens, require ventilation and cleaning to deter mold and mildew growth. Avoid carpeting on  concrete or damp floors, and storing items in damp areas. Reducing Pollen Exposure . Pollen seasons: trees (spring), grass (summer) and ragweed/weeds (fall). Calvin Deleon Kitchen Keep windows closed in your home and car to lower pollen exposure.  Calvin Deleon air conditioning in the bedroom and throughout the house if possible.  . Avoid going out in dry windy days - especially early morning. . Pollen counts are highest between 5 - 10 AM and on dry, hot and windy days.  . Save outside activities for late afternoon or after a heavy rain, when pollen levels are lower.  . Avoid mowing of grass if you have grass pollen allergy. Calvin Deleon Kitchen Be aware that pollen can also be transported indoors on people and pets.  . Dry your clothes in an automatic dryer rather than hanging them outside where they might collect pollen.  . Rinse hair and eyes before bedtime.   Buffered Isotonic Saline Irrigations:  Goal: . When you irrigate with the isotonic saline (salt water) it washes mucous and other debris from your nose that could be contributing to your nasal symptoms.   Recipe: Calvin Deleon Kitchen Obtain 1 quart jar that is clean . Fill with clean (bottled, boiled or distilled) water . Add 1-2 heaping teaspoons of salt without iodine o If the solution with 2 teaspoons of salt is too strong, adjust the amount down until better tolerated . Add 1 teaspoon of [employer] baking soda (pure bicarbonate) . Mix ingredients together and store at room temperature and discard after 1 week * Alternatively you can buy pre made salt packets for the NeilMed bottle or there          are other over the counter brands available  Instructions: . Warm  cup of the solution in the microwave if desired but be careful not to overheat as this will burn the inside of your nose . Stand over a sink (or do it while you shower) and squirt the solution into one side of your nose aiming towards the back of your head o Sometimes saying "coca cola" while irrigating can be helpful  to prevent fluid from going down your throat  . The solution will travel to the back of your nose and then come out the other side . Perform this again on the other side . Try to do this twice a day . If you are using a nasal spray in addition to the irrigation, irrigate first and then use the topical nasal spray otherwise you will wash the nasal spray out of your nose

## 2020-05-22 ENCOUNTER — Encounter: Payer: Self-pay | Admitting: Allergy

## 2020-05-22 DIAGNOSIS — J3089 Other allergic rhinitis: Secondary | ICD-10-CM | POA: Insufficient documentation

## 2020-05-22 DIAGNOSIS — H6123 Impacted cerumen, bilateral: Secondary | ICD-10-CM | POA: Insufficient documentation

## 2020-05-22 DIAGNOSIS — J302 Other seasonal allergic rhinitis: Secondary | ICD-10-CM | POA: Insufficient documentation

## 2020-05-22 NOTE — Assessment & Plan Note (Signed)
   May use over the counter debrox 5-10 drops twice a day for up to 4 days in a row to soften the earwax.  

## 2020-05-22 NOTE — Assessment & Plan Note (Addendum)
Perennial rhinitis symptoms for 20 years with worsening in the spring.  Tried Claritin, Allegra, Zyrtec, Xyzal and Flonase with some benefit.  No prior allergy evaluation.  Today's skin testing showed: Positive to mold, dust mites, grass, weed pollen.   Start environmental control measures as below.  Try Ryvent 6mg  twice a day.   This replaces Xyzal. If not covered let know.   This was not covered, sent in carbinoxamine 4mg  tablet (1 to 1.5 tablet) twice a day as needed. Start dymista (fluticasone + azelastine nasal spray combination) 1 spray per nostril twice a day. This replaces Flonase (fluticasone) for now. If it's not covered let us know.  This was not covered. May use Flonase (fluticasone) nasal spray 1 spray per nostril twice a day as needed for nasal congestion. May use azelastine nasal spray 1-2 sprays per nostril twice a day as needed for runny nose/drainage.  Nasal saline spray (i.e., Simply Saline) or nasal saline lavage (i.e., NeilMed) is recommended as needed and prior to medicated nasal sprays.  Consider allergy injections for long term control if above medications do not help the symptoms - handout given.

## 2020-09-08 ENCOUNTER — Ambulatory Visit: Payer: BC Managed Care – PPO | Admitting: Allergy

## 2020-09-08 ENCOUNTER — Encounter: Payer: Self-pay | Admitting: Allergy

## 2020-09-08 ENCOUNTER — Other Ambulatory Visit: Payer: Self-pay

## 2020-09-08 VITALS — BP 124/84 | HR 51 | Temp 98.5°F | Resp 16 | Ht 70.0 in | Wt 197.6 lb

## 2020-09-08 DIAGNOSIS — J3089 Other allergic rhinitis: Secondary | ICD-10-CM | POA: Diagnosis not present

## 2020-09-08 DIAGNOSIS — J302 Other seasonal allergic rhinitis: Secondary | ICD-10-CM | POA: Diagnosis not present

## 2020-09-08 MED ORDER — AZELASTINE HCL 0.1 % NA SOLN: 1.0000 | Freq: Two times a day (BID) | NASAL | 5 refills | Status: DC | PRN

## 2020-09-08 MED ORDER — AZELASTINE HCL 0.1 % NA SOLN: 1.0000 | Freq: Two times a day (BID) | NASAL | 2 refills | Status: DC | PRN

## 2020-09-08 MED ORDER — CARBINOXAMINE MALEATE 4 MG PO TABS: ORAL_TABLET | ORAL | 5 refills | Status: DC

## 2020-09-08 MED ORDER — CARBINOXAMINE MALEATE 4 MG PO TABS: ORAL_TABLET | ORAL | 2 refills | Status: DC

## 2020-09-08 NOTE — Patient Instructions (Addendum)
Environmental allergies 2022 skin testing showed: Positive to mold, dust mites, grass, weed pollen.  Continue environmental control measures as below. Continue with carbinoxamine 4mg  tablet (1 to 1.5 tablet) twice a day if needed. Use Flonase (fluticasone) nasal spray 1 spray per nostril twice a day as needed for nasal congestion.  Use azelastine nasal spray 1-2 sprays per nostril twice a day as needed for runny nose/drainage. Nasal saline spray (i.e., Simply Saline) or nasal saline lavage (i.e., NeilMed) is recommended as needed and prior to medicated nasal sprays.  Sent  3 months supply for azelastine and carbinoxamine. If you want dymista resent - let know.  Consider allergy injections for long term control if above medications do not help the symptoms.  Follow up in 6 months or sooner if needed.   Control of House Dust Mite Allergen Dust mite allergens are a common trigger of allergy and asthma symptoms. While they can be found throughout the house, these microscopic creatures thrive in warm, humid environments such as bedding, upholstered furniture and carpeting. Because so much time is spent in the bedroom, it is essential to reduce mite levels there.  Encase pillows, mattresses, and box springs in special allergen-proof fabric covers or airtight, zippered plastic covers.  Bedding should be washed weekly in hot water (130 F) and dried in a hot dryer. Allergen-proof covers are available for comforters and pillows that can't be regularly washed.  Wash the allergy-proof covers every few months. Minimize clutter in the bedroom. Keep pets out of the bedroom.  Keep humidity less than 50% by using a dehumidifier or air conditioning. You can buy a humidity measuring device called a hygrometer to monitor this.  If possible, replace carpets with hardwood, linoleum, or washable area rugs. If that's not possible, vacuum frequently with a vacuum that has a HEPA filter. Remove all upholstered  furniture and non-washable window drapes from the bedroom. Remove all non-washable stuffed toys from the bedroom.  Wash stuffed toys weekly.  Mold Control Mold and fungi can grow on a variety of surfaces provided certain temperature and moisture conditions exist.  Outdoor molds grow on plants, decaying vegetation and soil. The major outdoor mold, Alternaria and Cladosporium, are found in very high numbers during hot and dry conditions. Generally, a late summer - fall peak is seen for common outdoor fungal spores. Rain will temporarily lower outdoor mold spore count, but counts rise rapidly when the rainy period ends. The most important indoor molds are Aspergillus and Penicillium. Dark, humid and poorly ventilated basements are ideal sites for mold growth. The next most common sites of mold growth are the bathroom and the kitchen. Outdoor (Seasonal) Mold Control Use air conditioning and keep windows closed. Avoid exposure to decaying vegetation. Avoid leaf raking. Avoid grain handling. Consider wearing a face mask if working in moldy areas.  Indoor (Perennial) Mold Control  Maintain humidity below 50%. Get rid of mold growth on hard surfaces with water, detergent and, if necessary, 5% bleach (do not mix with other cleaners). Then dry the area completely. If mold covers an area more than 10 square feet, consider hiring an indoor environmental professional. For clothing, washing with soap and water is best. If moldy items cannot be cleaned and dried, throw them away. Remove sources e.g. contaminated carpets. Repair and seal leaking roofs or pipes. Using dehumidifiers in damp basements may be helpful, but empty the water and clean units regularly to prevent mildew from forming. All rooms, especially basements, bathrooms and kitchens, require ventilation  and cleaning to deter mold and mildew growth. Avoid carpeting on concrete or damp floors, and storing items in damp areas. Reducing Pollen  Exposure Pollen seasons: trees (spring), grass (summer) and ragweed/weeds (fall). Keep windows closed in your home and car to lower pollen exposure.  Install air conditioning in the bedroom and throughout the house if possible.  Avoid going out in dry windy days - especially early morning. Pollen counts are highest between 5 - 10 AM and on dry, hot and windy days.  Save outside activities for late afternoon or after a heavy rain, when pollen levels are lower.  Avoid mowing of grass if you have grass pollen allergy. Be aware that pollen can also be transported indoors on people and pets.  Dry your clothes in an automatic dryer rather than hanging them outside where they might collect pollen.  Rinse hair and eyes before bedtime.   Buffered Isotonic Saline Irrigations:  Goal: When you irrigate with the isotonic saline (salt water) it washes mucous and other debris from your nose that could be contributing to your nasal symptoms.   Recipe: Obtain 1 quart jar that is clean Fill with clean (bottled, boiled or distilled) water Add 1-2 heaping teaspoons of salt without iodine If the solution with 2 teaspoons of salt is too strong, adjust the amount down until better tolerated Add 1 teaspoon of [employer] baking soda (pure bicarbonate) Mix ingredients together and store at room temperature and discard after 1 week * Alternatively you can buy pre made salt packets for the NeilMed bottle or there          are other over the counter brands available  Instructions: Warm  cup of the solution in the microwave if desired but be careful not to overheat as this will burn the inside of your nose Stand over a sink (or do it while you shower) and squirt the solution into one side of your nose aiming towards the back of your head Sometimes saying "coca cola" while irrigating can be helpful to prevent fluid from going down your throat  The solution will travel to the back of your nose and then come out  the other side Perform this again on the other side Try to do this twice a day If you are using a nasal spray in addition to the irrigation, irrigate first and then use the topical nasal spray otherwise you will wash the nasal spray out of your nose

## 2020-09-08 NOTE — Progress Notes (Signed)
Follow Up Note  RE: Calvin Deleon MRN: [id number] DOB: 06-Apr-1980 Date of Office Visit: 09/08/2020  Referring provider: Clovis Riley, L.August Saucer, MD Primary care provider: Clovis Riley, Elbert Ewings.August Saucer, MD  Chief Complaint: Follow-up (Patient reports that the medications are working for him.)  History of Present Illness: I had the pleasure of seeing Calvin Deleon for a follow up visit at the [facility] on 09/08/2020. He is a 40 y.o. male, who is being followed for allergic rhinitis. His previous allergy office visit was on 05/21/2020 with Dr. Selena Batten. Today is a regular follow up visit.  Allergic rhinitis Currently on carbinoxamine 4mg  BID. Ryvent was not covered. Taking Flonase 1 spray BID and azelastine 1 spray BID. Some bloody nasal discharge at times. Dymista was not covered.  Symptoms are about 70-80% improved.  Not interested in AIT at this time.   Bilateral impacted cerumen Patient had his ears cleaned a few weeks ago.  Assessment and Plan: Calvin Deleon is a 40 y.o. male with: Seasonal and perennial allergic rhinitis Past history - perennial rhinitis symptoms for 20 years with worsening in the spring.  2022 skin testing showed: Positive to mold, dust mites, grass, weed pollen.  Interim history - RyVent and Dymista was not covered.  70 to 80% improvement with below regimen.  Not interested in AIT at this time. Continue environmental control measures as below. Continue with carbinoxamine 4mg  tablet (1 to 1.5 tablet) twice a day if needed. OR may take 1 tablet three times a day if needed instead.  Use Flonase (fluticasone) nasal spray 1 spray per nostril twice a day as needed for nasal congestion.  Use azelastine nasal spray 1-2 sprays per nostril twice a day as needed for runny nose/drainage. Nasal saline spray (i.e., Simply Saline) or nasal saline lavage (i.e., NeilMed) is recommended as needed and prior to medicated nasal sprays. If you want dymista resent - let 2023 know, maybe  cheaper using . Consider allergy injections for long term control if above medications do not help the symptoms.  Return in about 6 months (around 03/10/2021).  Meds ordered this encounter  Medications   DISCONTD: Carbinoxamine Maleate 4 MG TABS    Sig: Take 1 to 1.5 tablet twice a day for allergies.    Dispense:  60 tablet    Refill:  5   DISCONTD: azelastine (ASTELIN) 0.1 % nasal spray    Sig: Place 1-2 sprays into both nostrils 2 (two) times daily as needed (nasal drainage). Use in each nostril as directed    Dispense:  30 mL    Refill:  5   azelastine (ASTELIN) 0.1 % nasal spray    Sig: Place 1-2 sprays into both nostrils 2 (two) times daily as needed (nasal drainage). Use in each nostril as directed    Dispense:  90 mL    Refill:  2    3 months supply   Carbinoxamine Maleate 4 MG TABS    Sig: Take 1 to 1.5 tablet twice a day for allergies.    Dispense:  180 tablet    Refill:  2    3 months supply    Lab Orders  No laboratory test(s) ordered today    Diagnostics: None.  Medication List:  Current Outpatient Medications  Medication Sig Dispense Refill   acetaminophen (TYLENOL) 500 MG tablet Take 500-1,000 mg by mouth every 6 (six) hours as needed for mild pain or moderate pain.     COLLAGEN PO Take by mouth.  fluticasone (FLONASE) 50 MCG/ACT nasal spray Place 1 spray into both nostrils 2 (two) times daily as needed (nasal congestion). 16 g 5   GLUCOSAMINE HCL PO Take by mouth.     Omega-3 Fatty Acids (FISH OIL PO) Take by mouth.     ST JOHNS WORT PO Take by mouth.     Turmeric (QC TUMERIC COMPLEX PO) Take by mouth.     azelastine (ASTELIN) 0.1 % nasal spray Place 1-2 sprays into both nostrils 2 (two) times daily as needed (nasal drainage). Use in each nostril as directed 90 mL 2   Carbinoxamine Maleate 4 MG TABS Take 1 to 1.5 tablet twice a day for allergies. 180 tablet 2   No current facility-administered medications for this visit.   Allergies: No  Known Allergies I reviewed his past medical history, social history, family history, and environmental history and no significant changes have been reported from his previous visit.  Review of Systems  Constitutional:  Negative for appetite change, chills, fever and unexpected weight change.  HENT:  Positive for congestion, postnasal drip and rhinorrhea.   Eyes:  Negative for itching.  Respiratory:  Negative for cough, chest tightness, shortness of breath and wheezing.   Cardiovascular:  Negative for chest pain.  Gastrointestinal:  Negative for abdominal pain.  Genitourinary:  Negative for difficulty urinating.  Skin:  Negative for rash.  Allergic/Immunologic: Positive for environmental allergies.  Neurological:  Negative for headaches.   Objective: BP 124/84 (BP Location: Left Arm, Patient Position: Sitting, Cuff Size: Normal)   Pulse (!) 51   Temp 98.5 F (36.9 C) (Temporal)   Resp 16   Ht 5\' 10"  (1.778 m)   Wt 197 lb 9.6 oz (89.6 kg)   SpO2 98%   BMI 28.35 kg/m  Body mass index is 28.35 kg/m. Physical Exam Vitals and nursing note reviewed.  Constitutional:      Appearance: Normal appearance. He is well-developed.  HENT:     Head: Normocephalic and atraumatic.     Right Ear: Tympanic membrane and external ear normal.     Left Ear: Tympanic membrane and external ear normal.     Nose: Nose normal.     Mouth/Throat:     Mouth: Mucous membranes are moist.     Pharynx: Oropharynx is clear.  Eyes:     Conjunctiva/sclera: Conjunctivae normal.  Cardiovascular:     Rate and Rhythm: Normal rate and regular rhythm.     Heart sounds: Normal heart sounds. No murmur heard.   No friction rub. No gallop.  Pulmonary:     Effort: Pulmonary effort is normal.     Breath sounds: Normal breath sounds. No wheezing, rhonchi or rales.  Musculoskeletal:     Cervical back: Neck supple.  Skin:    General: Skin is warm.     Findings: No rash.  Neurological:     Mental Status: He is alert  and oriented to person, place, and time.  Psychiatric:        Behavior: Behavior normal.  Previous notes and tests were reviewed. The plan was reviewed with the patient/family, and all questions/concerned were addressed.  It was my pleasure to see Calvin Deleon today and participate in his care. Please feel free to contact me with any questions or concerns.  Sincerely,  Molli Hazard, DO Allergy & Immunology  [facility] office: [phone] [facility] office: [phone]

## 2020-09-08 NOTE — Assessment & Plan Note (Signed)
Past history - perennial rhinitis symptoms for 20 years with worsening in the spring.  2022 skin testing showed: Positive to mold, dust mites, grass, weed pollen.  Interim history - RyVent and Dymista was not covered.  70 to 80% improvement with below regimen.  Not interested in AIT at this time.  Continue environmental control measures as below.  Continue with carbinoxamine 4mg  tablet (1 to 1.5 tablet) twice a day if needed.  OR may take 1 tablet three times a day if needed instead.  . Use Flonase (fluticasone) nasal spray 1 spray per nostril twice a day as needed for nasal congestion.   Use azelastine nasal spray 1-2 sprays per nostril twice a day as needed for runny nose/drainage.  Nasal saline spray (i.e., Simply Saline) or nasal saline lavage (i.e., NeilMed) is recommended as needed and prior to medicated nasal sprays.  If you want dymista resent - let know, maybe cheaper using [place].  Consider allergy injections for long term control if above medications do not help the symptoms.

## 2020-10-24 DIAGNOSIS — M546 Pain in thoracic spine: Secondary | ICD-10-CM | POA: Diagnosis not present

## 2020-10-24 DIAGNOSIS — M7542 Impingement syndrome of left shoulder: Secondary | ICD-10-CM | POA: Diagnosis not present

## 2021-02-20 ENCOUNTER — Telehealth: Payer: BC Managed Care – PPO | Admitting: Nurse Practitioner

## 2021-02-20 DIAGNOSIS — J0100 Acute maxillary sinusitis, unspecified: Secondary | ICD-10-CM

## 2021-02-20 MED ORDER — AMOXICILLIN-POT CLAVULANATE 875-125 MG PO TABS: 1.0000 | ORAL_TABLET | Freq: Two times a day (BID) | ORAL | 0 refills | Status: DC

## 2021-02-20 NOTE — Progress Notes (Signed)

## 2021-02-27 DIAGNOSIS — Z13220 Encounter for screening for lipoid disorders: Secondary | ICD-10-CM | POA: Diagnosis not present

## 2021-02-27 DIAGNOSIS — Z1159 Encounter for screening for other viral diseases: Secondary | ICD-10-CM | POA: Diagnosis not present

## 2021-02-27 DIAGNOSIS — Z131 Encounter for screening for diabetes mellitus: Secondary | ICD-10-CM | POA: Diagnosis not present

## 2021-02-27 DIAGNOSIS — Z0000 Encounter for general adult medical examination without abnormal findings: Secondary | ICD-10-CM | POA: Diagnosis not present

## 2021-03-11 ENCOUNTER — Ambulatory Visit: Payer: BC Managed Care – PPO | Admitting: Allergy

## 2021-04-06 ENCOUNTER — Other Ambulatory Visit: Payer: Self-pay

## 2021-04-06 ENCOUNTER — Ambulatory Visit (INDEPENDENT_AMBULATORY_CARE_PROVIDER_SITE_OTHER): Payer: BC Managed Care – PPO | Admitting: Allergy

## 2021-04-06 ENCOUNTER — Encounter: Payer: Self-pay | Admitting: Allergy

## 2021-04-06 VITALS — BP 122/82 | HR 68 | Temp 98.2°F | Resp 16 | Ht 70.0 in | Wt 185.1 lb

## 2021-04-06 DIAGNOSIS — J3089 Other allergic rhinitis: Secondary | ICD-10-CM | POA: Diagnosis not present

## 2021-04-06 DIAGNOSIS — L299 Pruritus, unspecified: Secondary | ICD-10-CM | POA: Diagnosis not present

## 2021-04-06 MED ORDER — RYALTRIS 665-25 MCG/ACT NA SUSP: 1.0000 | Freq: Two times a day (BID) | NASAL | 5 refills | Status: DC

## 2021-04-06 MED ORDER — CARBINOXAMINE MALEATE 4 MG PO TABS: ORAL_TABLET | ORAL | 2 refills | Status: DC

## 2021-04-06 NOTE — Progress Notes (Signed)
? ?Follow Up Note ? ?RE: Calvin Deleon MRN: [id number] DOB: 1980/01/22 ?Date of Office Visit: 04/06/2021 ? ?Referring provider: Alroy Dust, L.Marlou Sa, MD ?Primary care provider: Collene Leyden, MD ? ?Chief Complaint: Follow-up (Feels a little better--been itchy and dry more recently this winter--azelastine & flonase 1 spray into both nostril once daily) ? ?History of Present Illness: ?I had the pleasure of seeing Calvin Deleon for a follow up visit at the [facility] on 04/06/2021. He is a 41 y.o. male, who is being followed for allergic rhinitis. His previous allergy office visit was on 09/08/2020 with Dr. Maudie Mercury. Today is a regular follow up visit. ? ?Seasonal and perennial allergic rhinitis ?Currently taking carbinoxamine 4mg  BID, Flonase and azelastine with good benefit. ?Some increased itching in the winter. Does not moisturize daily. ? ?Had sinus infection requiring antibiotics when he came off all the meds for 3 weeks.  ? ?Assessment and Plan: ?[name] is a 41 y.o. male with: ?Seasonal and perennial allergic rhinitis ?Past history - perennial rhinitis symptoms for 20 years with worsening in the spring.  2022 skin testing showed: Positive to mold, dust mites, grass, weed pollen. RyVent and Dymista not covered. ?Interim history -  Stable. Not interested in AIT. ?Continue environmental control measures.  ?Continue with carbinoxamine 4mg  tablet (1 to 1.5 tablet) twice a day if needed. ?Start Ryaltris (olopatadine + mometasone nasal spray combination) 1-2 sprays per nostril twice a day. Sample given. ?If this works well then get the prescription. ?Otherwise you can continue with the Flonase and azelastine combination as before.  ?Nasal saline spray (i.e., Simply Saline) or nasal saline lavage (i.e., NeilMed) is recommended as needed and prior to medicated nasal sprays. ? ?Pruritus ?Itchy skin during the winter months. ?See below for proper skin care. ?Make sure to moisturize daily during the winter  month. ? ?Return in about 1 year (around 04/07/2022). ? ?Meds ordered this encounter  ?Medications  ? Olopatadine-Mometasone (RYALTRIS) [id number] MCG/ACT SUSP  ?  Sig: Place 1-2 sprays into the nose in the morning and at bedtime.  ?  Dispense:  29 g  ?  Refill:  5  ?  [phone]  ? Carbinoxamine Maleate 4 MG TABS  ?  Sig: Take 1 to 1.5 tablet twice a day for allergies.  ?  Dispense:  180 tablet  ?  Refill:  2  ?  3 months supply  ? ?Lab Orders  ?No laboratory test(s) ordered today  ? ? ?Diagnostics: ?None.  ? ?Medication List:  ?Current Outpatient Medications  ?Medication Sig Dispense Refill  ? acetaminophen (TYLENOL) 500 MG tablet Take 500-1,000 mg by mouth every 6 (six) hours as needed for mild pain or moderate pain.    ? azelastine (ASTELIN) 0.1 % nasal spray Place 1-2 sprays into both nostrils 2 (two) times daily as needed (nasal drainage). Use in each nostril as directed (Patient taking differently: Place 1 spray into both nostrils daily. Use in each nostril as directed) 90 mL 2  ? COLLAGEN PO Take by mouth.    ? fluticasone (FLONASE) 50 MCG/ACT nasal spray Place 1 spray into both nostrils 2 (two) times daily as needed (nasal congestion). (Patient taking differently: Place 1 spray into both nostrils daily.) 16 g 5  ? GLUCOSAMINE HCL PO Take by mouth.    ? Olopatadine-Mometasone (RYALTRIS) [id number] MCG/ACT SUSP Place 1-2 sprays into the nose in the morning and at bedtime. 29 g 5  ? Omega-3 Fatty Acids (FISH OIL PO) Take by mouth.    ?  ST JOHNS WORT PO Take by mouth.    ? Turmeric (QC TUMERIC COMPLEX PO) Take by mouth.    ? Carbinoxamine Maleate 4 MG TABS Take 1 to 1.5 tablet twice a day for allergies. 180 tablet 2  ? ?No current facility-administered medications for this visit.  ? ?Allergies: ?No Known Allergies ?I reviewed his past medical history, social history, family history, and environmental history and no significant changes have been reported from his previous visit. ? ?Review of Systems  ?Constitutional:   Negative for appetite change, chills, fever and unexpected weight change.  ?HENT:  Positive for congestion, postnasal drip and rhinorrhea.   ?Eyes:  Negative for itching.  ?Respiratory:  Negative for cough, chest tightness, shortness of breath and wheezing.   ?Cardiovascular:  Negative for chest pain.  ?Gastrointestinal:  Negative for abdominal pain.  ?Genitourinary:  Negative for difficulty urinating.  ?Skin:  Negative for rash.  ?Allergic/Immunologic: Positive for environmental allergies.  ?Neurological:  Negative for headaches.  ? ?Objective: ?BP 122/82   Pulse 68   Temp 98.2 ?F (36.8 ?C)   Resp 16   Ht 5\' 10"  (1.778 m)   Wt 185 lb 2 oz (84 kg)   SpO2 98%   BMI 26.56 kg/m?  ?Body mass index is 26.56 kg/m?[name] Kitchen ?Physical Exam ?Vitals and nursing note reviewed.  ?Constitutional:   ?   Appearance: Normal appearance. He is well-developed.  ?HENT:  ?   Head: Normocephalic and atraumatic.  ?   Right Ear: Tympanic membrane and external ear normal.  ?   Left Ear: Tympanic membrane and external ear normal.  ?   Nose: Nose normal.  ?   Mouth/Throat:  ?   Mouth: Mucous membranes are moist.  ?   Pharynx: Oropharynx is clear.  ?Eyes:  ?   Conjunctiva/sclera: Conjunctivae normal.  ?Cardiovascular:  ?   Rate and Rhythm: Normal rate and regular rhythm.  ?   Heart sounds: Normal heart sounds. No murmur heard. ?  No friction rub. No gallop.  ?Pulmonary:  ?   Effort: Pulmonary effort is normal.  ?   Breath sounds: Normal breath sounds. No wheezing, rhonchi or rales.  ?Musculoskeletal:  ?   Cervical back: Neck supple.  ?Skin: ?   General: Skin is warm.  ?   Findings: No rash.  ?Neurological:  ?   Mental Status: He is alert and oriented to person, place, and time.  ?Psychiatric:     ?   Behavior: Behavior normal.  ?Previous notes and tests were reviewed. ?The plan was reviewed with the patient/family, and all questions/concerned were addressed. ? ?It was my pleasure to see [name] today and participate in his care. Please feel  free to contact me with any questions or concerns. ? ?Sincerely, ? ?Rexene Alberts, DO ?Allergy & Immunology ? ?[facility] ?[place] office: [phone] ?[place] office: [phone] ?

## 2021-04-06 NOTE — Assessment & Plan Note (Signed)
Past history - perennial rhinitis symptoms for 20 years with worsening in the spring.  2022 skin testing showed: Positive to mold, dust mites, grass, weed pollen. RyVent and Dymista not covered. ?Interim history -  Stable. Not interested in AIT. ?? Continue environmental control measures.  ?? Continue with carbinoxamine 4mg  tablet (1 to 1.5 tablet) twice a day if needed. ?? Start Ryaltris (olopatadine + mometasone nasal spray combination) 1-2 sprays per nostril twice a day. Sample given. ?? If this works well then get the prescription. ?? Otherwise you can continue with the Flonase and azelastine combination as before.  ?? Nasal saline spray (i.e., Simply Saline) or nasal saline lavage (i.e., NeilMed) is recommended as needed and prior to medicated nasal sprays. ?

## 2021-04-06 NOTE — Assessment & Plan Note (Signed)
Itchy skin during the winter months. ?? See below for proper skin care. ?? Make sure to moisturize daily during the winter month. ?

## 2021-04-06 NOTE — Patient Instructions (Addendum)
Environmental allergies ?2022 skin testing showed: Positive to mold, dust mites, grass, weed pollen.  ?Continue environmental control measures.  ?Continue with carbinoxamine 4mg  tablet (1 to 1.5 tablet) twice a day if needed. ?Start Ryaltris (olopatadine + mometasone nasal spray combination) 1-2 sprays per nostril twice a day. Sample given. ?If this works well then get the prescription. ?Otherwise you can continue with the Flonase and azelastine combination.  ?Nasal saline spray (i.e., Simply Saline) or nasal saline lavage (i.e., NeilMed) is recommended as needed and prior to medicated nasal sprays. ?Consider allergy injections for long term control if above medications do not help the symptoms. ? ?Itching: ?See below for proper skin care. ?Make sure you moisturize daily during the winter month. ? ?Follow up in 12 months or sooner if needed.  ? ?Skin care recommendations ? ?Bath time: ?Always use lukewarm water. AVOID very hot or cold water. ?Keep bathing time to 5-10 minutes. ?Do NOT use bubble bath. ?Use a mild soap and use just enough to wash the dirty areas. ?Do NOT scrub skin vigorously.  ?After bathing, pat dry your skin with a towel. Do NOT rub or scrub the skin. ? ?Moisturizers and prescriptions:  ?ALWAYS apply moisturizers immediately after bathing (within 3 minutes). This helps to lock-in moisture. ?Use the moisturizer several times a day over the whole body. ?Good summer moisturizers include: Aveeno, CeraVe, Cetaphil. ?Good winter moisturizers include: Aquaphor, Vaseline, Cerave, Cetaphil, Eucerin, Vanicream. ?When using moisturizers along with medications, the moisturizer should be applied about one hour after applying the medication to prevent diluting effect of the medication or moisturize around where you applied the medications. When not using medications, the moisturizer can be continued twice daily as maintenance. ? ?Laundry and clothing: ?Avoid laundry products with added color or perfumes. ?Use  unscented hypo-allergenic laundry products such as Tide free, Cheer free & gentle, and All free and clear.  ?If the skin still seems dry or sensitive, you can try double-rinsing the clothes. ?Avoid tight or scratchy clothing such as wool. ?Do not use fabric softeners or dyer sheets. ? ?

## 2021-09-28 ENCOUNTER — Other Ambulatory Visit: Payer: Self-pay | Admitting: Internal Medicine

## 2021-09-28 MED ORDER — AMOXICILLIN-POT CLAVULANATE 875-125 MG PO TABS: 1.0000 | ORAL_TABLET | Freq: Two times a day (BID) | ORAL | 0 refills | Status: DC

## 2022-01-01 ENCOUNTER — Other Ambulatory Visit: Payer: Self-pay

## 2022-01-01 MED ORDER — RYALTRIS 665-25 MCG/ACT NA SUSP: 1.0000 | Freq: Two times a day (BID) | NASAL | 5 refills | Status: DC

## 2022-04-14 ENCOUNTER — Other Ambulatory Visit: Payer: Self-pay | Admitting: Allergy

## 2022-04-25 NOTE — Progress Notes (Signed)
Follow Up Note  RE: Calvin Deleon MRN: [id number] DOB: 1980/04/29 Date of Office Visit: 04/26/2022  Referring provider: Irven Coe, MD Primary care provider: Irven Coe, MD  Chief Complaint: Follow-up and Medication Refill  History of Present Illness: I had the pleasure of seeing Calvin Deleon for a follow up visit at the [facility] on 04/26/2022. He is a 42 y.o. male, who is being followed for allergic rhinitis and pruritus. His previous allergy office visit was on 04/06/2021 with Dr. Selena Batten. Today is a regular follow up visit.  Seasonal and perennial allergic rhinitis Currently taking carbinoxamine 8mg  in the morning and 4mg  in the evening due to increased pollen. Usually takes 4mg  BID.  Takes Ryaltris 1 spray per nostril BID with good benefit. No nosebleeds.  Some ear fullness after flying.    Pruritus Itchy skin during the winter months. Moisturizing daily helped.  Assessment and Plan: Calvin Deleon is a 42 y.o. male with: Seasonal and perennial allergic rhinitis Past history - perennial rhinitis symptoms for 20 years with worsening in the spring.  2022 skin testing showed: Positive to mold, dust mites, grass, weed pollen. RyVent and Dymista not covered. Not interested in AIT. Interim history -  slightly flared the last 3 weeks. Continue environmental control measures.  Continue with carbinoxamine 4mg  tablet (1 to 1.5 tablet) twice a day if needed. Continue Ryaltris (olopatadine + mometasone nasal spray combination) 1-2 sprays per nostril twice a day.  Nasal saline spray (i.e., Simply Saline) or nasal saline lavage (i.e., NeilMed) is recommended as needed and prior to medicated nasal sprays. If allergies flare, then call us and I'll send in prescription for montelukast (Singulair) next.  Consider allergy injections for long term control if above medications do not help the symptoms.  Pruritus Past history - Itchy skin during the winter months. Continue proper  skin care. Make sure to moisturize daily during the winter month.  Eustachian tube dysfunction, bilateral After flying. You can take an over the counter decongestant (like sudafed) if needed prior to flying to see if it helps. Don't recommend using it frequently as it can cause blood pressure issues.   Return in about 1 year (around 04/26/2023).  Meds ordered this encounter  Medications   Olopatadine-Mometasone (RYALTRIS) 665-25 MCG/ACT SUSP    Sig: Place 1-2 sprays into the nose in the morning and at bedtime.    Dispense:  29 g    Refill:  11    [phone]   Carbinoxamine Maleate 4 MG TABS    Sig: Take 1 to 1.5 tablet twice a day for allergies.    Dispense:  180 tablet    Refill:  3    3 months supply   Lab Orders  No laboratory test(s) ordered today    Diagnostics: None.   Medication List:  Current Outpatient Medications  Medication Sig Dispense Refill   COLLAGEN PO Take by mouth.     GLUCOSAMINE HCL PO Take by mouth.     Omega-3 Fatty Acids (FISH OIL PO) Take by mouth.     Turmeric (QC TUMERIC COMPLEX PO) Take by mouth.     buPROPion (WELLBUTRIN XL) 300 MG 24 hr tablet Take 300 mg by mouth daily.     Carbinoxamine Maleate 4 MG TABS Take 1 to 1.5 tablet twice a day for allergies. 180 tablet 3   Olopatadine-Mometasone (RYALTRIS) 665-25 MCG/ACT SUSP Place 1-2 sprays into the nose in the morning and at bedtime. 29 g 11   No  current facility-administered medications for this visit.   Allergies: No Known Allergies I reviewed his past medical history, social history, family history, and environmental history and no significant changes have been reported from his previous visit.  Review of Systems  Constitutional:  Negative for appetite change, chills, fever and unexpected weight change.  HENT:  Negative for congestion, postnasal drip and rhinorrhea.   Eyes:  Negative for itching.  Respiratory:  Negative for cough, chest tightness, shortness of breath and wheezing.    Cardiovascular:  Negative for chest pain.  Gastrointestinal:  Negative for abdominal pain.  Genitourinary:  Negative for difficulty urinating.  Skin:  Negative for rash.  Allergic/Immunologic: Positive for environmental allergies.  Neurological:  Negative for headaches.    Objective: BP 122/70   Pulse 70   Temp 98.9 F (37.2 C) (Temporal)   Resp 16   Wt 196 lb 8 oz (89.1 kg)   SpO2 98%   BMI 28.19 kg/m  Body mass index is 28.19 kg/m. Physical Exam Vitals and nursing note reviewed.  Constitutional:      Appearance: Normal appearance. He is well-developed.  HENT:     Head: Normocephalic and atraumatic.     Right Ear: Tympanic membrane and external ear normal.     Left Ear: Tympanic membrane and external ear normal.     Nose: Nose normal.     Mouth/Throat:     Mouth: Mucous membranes are moist.     Pharynx: Oropharynx is clear.  Eyes:     Conjunctiva/sclera: Conjunctivae normal.  Cardiovascular:     Rate and Rhythm: Normal rate and regular rhythm.     Heart sounds: Normal heart sounds. No murmur heard.    No friction rub. No gallop.  Pulmonary:     Effort: Pulmonary effort is normal.     Breath sounds: Normal breath sounds. No wheezing, rhonchi or rales.  Musculoskeletal:     Cervical back: Neck supple.  Skin:    General: Skin is warm.     Findings: No rash.  Neurological:     Mental Status: He is alert and oriented to person, place, and time.  Psychiatric:        Behavior: Behavior normal.   Previous notes and tests were reviewed. The plan was reviewed with the patient/family, and all questions/concerned were addressed.  It was my pleasure to see Calvin Deleon today and participate in his care. Please feel free to contact me with any questions or concerns.  Sincerely,  Wyline Mood, DO Allergy & Immunology  [facility] office: [phone] [facility] office: [phone]

## 2022-04-26 ENCOUNTER — Other Ambulatory Visit: Payer: Self-pay

## 2022-04-26 ENCOUNTER — Encounter: Payer: Self-pay | Admitting: Allergy

## 2022-04-26 ENCOUNTER — Ambulatory Visit (INDEPENDENT_AMBULATORY_CARE_PROVIDER_SITE_OTHER): Payer: BC Managed Care – PPO | Admitting: Allergy

## 2022-04-26 VITALS — BP 122/70 | HR 70 | Temp 98.9°F | Resp 16 | Wt 196.5 lb

## 2022-04-26 DIAGNOSIS — J3089 Other allergic rhinitis: Secondary | ICD-10-CM

## 2022-04-26 DIAGNOSIS — L299 Pruritus, unspecified: Secondary | ICD-10-CM

## 2022-04-26 DIAGNOSIS — J302 Other seasonal allergic rhinitis: Secondary | ICD-10-CM | POA: Diagnosis not present

## 2022-04-26 DIAGNOSIS — H6993 Unspecified Eustachian tube disorder, bilateral: Secondary | ICD-10-CM | POA: Insufficient documentation

## 2022-04-26 MED ORDER — RYALTRIS 665-25 MCG/ACT NA SUSP: 1.0000 | Freq: Two times a day (BID) | NASAL | 11 refills | Status: DC

## 2022-04-26 MED ORDER — CARBINOXAMINE MALEATE 4 MG PO TABS: ORAL_TABLET | ORAL | 3 refills | Status: DC

## 2022-04-26 NOTE — Assessment & Plan Note (Signed)
Past history - Itchy skin during the winter months. Continue proper skin care. Make sure to moisturize daily during the winter month.

## 2022-04-26 NOTE — Assessment & Plan Note (Addendum)
Past history - perennial rhinitis symptoms for 20 years with worsening in the spring.  2022 skin testing showed: Positive to mold, dust mites, grass, weed pollen. RyVent and Dymista not covered. Not interested in AIT. Interim history -  slightly flared the last 3 weeks. Continue environmental control measures.  Continue with carbinoxamine 4mg  tablet (1 to 1.5 tablet) twice a day if needed. Continue Ryaltris (olopatadine + mometasone nasal spray combination) 1-2 sprays per nostril twice a day.  Nasal saline spray (i.e., Simply Saline) or nasal saline lavage (i.e., NeilMed) is recommended as needed and prior to medicated nasal sprays. If allergies flare, then call us and I'll send in prescription for montelukast (Singulair) next.  Consider allergy injections for long term control if above medications do not help the symptoms.

## 2022-04-26 NOTE — Assessment & Plan Note (Signed)
After flying. You can take an over the counter decongestant (like sudafed) if needed prior to flying to see if it helps. Don't recommend using it frequently as it can cause blood pressure issues.

## 2022-04-26 NOTE — Patient Instructions (Addendum)
Environmental allergies 2022 skin testing showed: Positive to mold, dust mites, grass, weed pollen.  Continue environmental control measures.  Continue with carbinoxamine 4mg  tablet (1 to 1.5 tablet) twice a day if needed. Continue Ryaltris (olopatadine + mometasone nasal spray combination) 1-2 sprays per nostril twice a day.  Nasal saline spray (i.e., Simply Saline) or nasal saline lavage (i.e., NeilMed) is recommended as needed and prior to medicated nasal sprays. If allergies flare, then call us and I'll send in prescription for montelukast (Singulair) next.  Consider allergy injections for long term control if above medications do not help the symptoms.  Ears You can take an over the counter decongestant (like sudafed) if needed prior to flying to see if it helps. Don't recommend using it frequently as it can cause blood pressure issues.   Itching: Continue proper skin care. Make sure you moisturize daily during the winter month.  Follow up in 12 months or sooner if needed.   Skin care recommendations  Bath time: Always use lukewarm water. AVOID very hot or cold water. Keep bathing time to 5-10 minutes. Do NOT use bubble bath. Use a mild soap and use just enough to wash the dirty areas. Do NOT scrub skin vigorously.  After bathing, pat dry your skin with a towel. Do NOT rub or scrub the skin.  Moisturizers and prescriptions:  ALWAYS apply moisturizers immediately after bathing (within 3 minutes). This helps to lock-in moisture. Use the moisturizer several times a day over the whole body. Good summer moisturizers include: Aveeno, CeraVe, Cetaphil. Good winter moisturizers include: Aquaphor, Vaseline, Cerave, Cetaphil, Eucerin, Vanicream. When using moisturizers along with medications, the moisturizer should be applied about one hour after applying the medication to prevent diluting effect of the medication or moisturize around where you applied the medications. When not using  medications, the moisturizer can be continued twice daily as maintenance.  Laundry and clothing: Avoid laundry products with added color or perfumes. Use unscented hypo-allergenic laundry products such as Tide free, Cheer free & gentle, and All free and clear.  If the skin still seems dry or sensitive, you can try double-rinsing the clothes. Avoid tight or scratchy clothing such as wool. Do not use fabric softeners or dyer sheets.

## 2022-06-16 DIAGNOSIS — M7541 Impingement syndrome of right shoulder: Secondary | ICD-10-CM | POA: Diagnosis not present

## 2022-10-13 ENCOUNTER — Telehealth: Payer: Self-pay | Admitting: Physician Assistant

## 2022-10-13 DIAGNOSIS — L03313 Cellulitis of chest wall: Secondary | ICD-10-CM

## 2022-10-13 MED ORDER — CEPHALEXIN 500 MG PO CAPS
500.0000 mg | ORAL_CAPSULE | Freq: Four times a day (QID) | ORAL | 0 refills | Status: AC
Start: 2022-10-13 — End: 2022-10-18

## 2022-10-13 NOTE — Progress Notes (Signed)
E Visit for Cellulitis  We are sorry that you are not feeling well. Here is how we plan to help!  Based on what you shared with me it looks like you have cellulitis.  Cellulitis looks like areas of skin redness, swelling, and warmth; it develops as a result of bacteria entering under the skin. Little red spots and/or bleeding can be seen in skin, and tiny surface sacs containing fluid can occur. Fever can be present. Cellulitis is almost always on one side of a body, and the lower limbs are the most common site of involvement.   I have prescribed:  Keflex 500mg take one by mouth four times a day for 5 days  HOME CARE:  Take your medications as ordered and take all of them, even if the skin irritation appears to be healing.   GET HELP RIGHT AWAY IF:  Symptoms that don't begin to go away within 48 hours. Severe redness persists or worsens If the area turns color, spreads or swells. If it blisters and opens, develops yellow-brown crust or bleeds. You develop a fever or chills. If the pain increases or becomes unbearable.  Are unable to keep fluids and food down.  MAKE SURE YOU   Understand these instructions. Will watch your condition. Will get help right away if you are not doing well or get worse.  Thank you for choosing an e-visit.  Your e-visit answers were reviewed by a board certified advanced clinical practitioner to complete your personal care plan. Depending upon the condition, your plan could have included both over the counter or prescription medications.  Please review your pharmacy choice. Make sure the pharmacy is open so you can pick up prescription now. If there is a problem, you may contact your provider through MyChart messaging and have the prescription routed to another pharmacy.  Your safety is important to us. If you have drug allergies check your prescription carefully.   For the next 24 hours you can use MyChart to ask questions about today's visit, request a  non-urgent call back, or ask for a work or school excuse. You will get an email in the next two days asking about your experience. I hope that your e-visit has been valuable and will speed your recovery.  

## 2022-10-13 NOTE — Progress Notes (Signed)
I have spent 5 minutes in review of e-visit questionnaire, review and updating patient chart, medical decision making and response to patient.   Mia Milan Cody Jacklynn Dehaas, PA-C    

## 2023-04-24 NOTE — Patient Instructions (Incomplete)
 Allergic rhinitis Continue allergen avoidance measures directed toward mold, dust mite, grass pollen, and weed pollen as listed below Continue carbinoxamine 1 to 1-1/2 tablets up to twice a day if needed for runny nose or itch Continue Ryaltris 2 sprays in each nostril twice a day if needed for nasal symptoms Consider saline nasal rinses as needed for nasal symptoms. Use this before any medicated nasal sprays for best result Consider allergen immunotherapy if your symptoms are not well-controlled with the treatment plan as listed above  Pruritus Continue carbinoxamine as listed above if needed for itch Continue a daily moisturizer especially during the winter months  Eustachian tube dysfunction Take an over-the-counter decongestant before flying  Call the clinic if this treatment plan is not working well for you.  Follow up in *** or sooner if needed.  Reducing Pollen Exposure The American Academy of Allergy, Asthma and Immunology suggests the following steps to reduce your exposure to pollen during allergy seasons. Do not hang sheets or clothing out to dry; pollen may collect on these items. Do not mow lawns or spend time around freshly cut grass; mowing stirs up pollen. Keep windows closed at night.  Keep car windows closed while driving. Minimize morning activities outdoors, a time when pollen counts are usually at their highest. Stay indoors as much as possible when pollen counts or humidity is high and on windy days when pollen tends to remain in the air longer. Use air conditioning when possible.  Many air conditioners have filters that trap the pollen spores. Use a HEPA room air filter to remove pollen form the indoor air you breathe.   Control of Dust Mite Allergen Dust mites play a major role in allergic asthma and rhinitis. They occur in environments with high humidity wherever human skin is found. Dust mites absorb humidity from the atmosphere (ie, they do not drink) and feed  on organic matter (including shed human and animal skin). Dust mites are a microscopic type of insect that you cannot see with the naked eye. High levels of dust mites have been detected from mattresses, pillows, carpets, upholstered furniture, bed covers, clothes, soft toys and any woven material. The principal allergen of the dust mite is found in its feces. A gram of dust may contain 1,000 mites and 250,000 fecal particles. Mite antigen is easily measured in the air during house cleaning activities. Dust mites do not bite and do not cause harm to humans, other than by triggering allergies/asthma.  Ways to decrease your exposure to dust mites in your home:  1. Encase mattresses, box springs and pillows with a mite-impermeable barrier or cover  2. Wash sheets, blankets and drapes weekly in hot water (130 F) with detergent and dry them in a dryer on the hot setting.  3. Have the room cleaned frequently with a vacuum cleaner and a damp dust-mop. For carpeting or rugs, vacuuming with a vacuum cleaner equipped with a high-efficiency particulate air (HEPA) filter. The dust mite allergic individual should not be in a room which is being cleaned and should wait 1 hour after cleaning before going into the room.  4. Do not sleep on upholstered furniture (eg, couches).  5. If possible removing carpeting, upholstered furniture and drapery from the home is ideal. Horizontal blinds should be eliminated in the rooms where the person spends the most time (bedroom, study, television room). Washable vinyl, roller-type shades are optimal.  6. Remove all non-washable stuffed toys from the bedroom. Wash stuffed toys weekly like sheets  and blankets above.  7. Reduce indoor humidity to less than 50%. Inexpensive humidity monitors can be purchased at most hardware stores. Do not use a humidifier as can make the problem worse and are not recommended.  Control of Mold Allergen Mold and fungi can grow on a variety of  surfaces provided certain temperature and moisture conditions exist.  Outdoor molds grow on plants, decaying vegetation and soil.  The major outdoor mold, Alternaria and Cladosporium, are found in very high numbers during hot and dry conditions.  Generally, a late Summer - Fall peak is seen for common outdoor fungal spores.  Rain will temporarily lower outdoor mold spore count, but counts rise rapidly when the rainy period ends.  The most important indoor molds are Aspergillus and Penicillium.  Dark, humid and poorly ventilated basements are ideal sites for mold growth.  The next most common sites of mold growth are the bathroom and the kitchen.  Outdoor [employer] Use air conditioning and keep windows closed Avoid exposure to decaying vegetation. Avoid leaf raking. Avoid grain handling. Consider wearing a face mask if working in moldy areas.  Indoor Mold Control Maintain humidity below 50%. Clean washable surfaces with 5% bleach solution. Remove sources e.g. Contaminated carpets.

## 2023-04-24 NOTE — Progress Notes (Unsigned)
   [address] [place] [address] Dept: [phone]  FOLLOW UP NOTE  Patient ID: Taeshawn Helfman, male    DOB: 1981/01/09  Age: 43 y.o. MRN: [id number] Date of Office Visit: 04/25/2023  Assessment  Chief Complaint: No chief complaint on file.  HPI Darreld Hoffer is a 43 year old male who presents to the clinic for follow-up visit.  He was last seen in this clinic on 04/26/2022 by Dr. Burdette Carolin for evaluation of allergic rhinitis, pruritus, and eustachian tube dysfunction while flying.  His last environmental allergy skin testing was on 05/22/2020 was positive to mold, dust mite, grass pollen, and weed pollen.  Discussed the use of AI scribe software for clinical note transcription with the patient, who gave verbal consent to proceed.  History of Present Illness      Drug Allergies:  No Known Allergies  Physical Exam: There were no vitals taken for this visit.   Physical Exam  Diagnostics:    Assessment and Plan: No diagnosis found.  No orders of the defined types were placed in this encounter.   There are no Patient Instructions on file for this visit.  No follow-ups on file.    Thank you for the opportunity to care for this patient.  Please do not hesitate to contact me with questions.  Marinus Sic, FNP [facility]

## 2023-04-25 ENCOUNTER — Other Ambulatory Visit: Payer: Self-pay

## 2023-04-25 ENCOUNTER — Encounter: Payer: Self-pay | Admitting: Family Medicine

## 2023-04-25 ENCOUNTER — Ambulatory Visit (INDEPENDENT_AMBULATORY_CARE_PROVIDER_SITE_OTHER): Admitting: Family Medicine

## 2023-04-25 VITALS — BP 136/74 | HR 91 | Temp 98.1°F | Resp 16 | Ht 69.69 in | Wt 196.7 lb

## 2023-04-25 DIAGNOSIS — J302 Other seasonal allergic rhinitis: Secondary | ICD-10-CM

## 2023-04-25 DIAGNOSIS — L299 Pruritus, unspecified: Secondary | ICD-10-CM | POA: Diagnosis not present

## 2023-04-25 DIAGNOSIS — J3089 Other allergic rhinitis: Secondary | ICD-10-CM | POA: Diagnosis not present

## 2023-04-25 DIAGNOSIS — H6993 Unspecified Eustachian tube disorder, bilateral: Secondary | ICD-10-CM | POA: Diagnosis not present

## 2023-04-25 MED ORDER — CARBINOXAMINE MALEATE 4 MG PO TABS: ORAL_TABLET | ORAL | 5 refills | Status: AC

## 2023-04-25 MED ORDER — AZELASTINE-FLUTICASONE 137-50 MCG/ACT NA SUSP: 2.0000 | NASAL | 5 refills | Status: AC

## 2023-04-25 MED ORDER — MONTELUKAST SODIUM 10 MG PO TABS: 10.0000 mg | ORAL_TABLET | Freq: Every day | ORAL | 5 refills | Status: DC

## 2023-04-27 ENCOUNTER — Ambulatory Visit: Payer: BC Managed Care – PPO | Admitting: Allergy

## 2023-07-13 ENCOUNTER — Other Ambulatory Visit: Payer: Self-pay

## 2023-10-15 ENCOUNTER — Other Ambulatory Visit: Payer: Self-pay | Admitting: Family Medicine

## 2024-04-25 ENCOUNTER — Ambulatory Visit: Admitting: Allergy
